# Patient Record
Sex: Female | Born: 1980
Health system: Southern US, Community
[De-identification: ages and names within clinical notes are randomized; demographics above are authoritative.]

## PROBLEM LIST (undated history)

## (undated) DIAGNOSIS — F329 Major depressive disorder, single episode, unspecified: Secondary | ICD-10-CM

## (undated) DIAGNOSIS — F32A Depression, unspecified: Secondary | ICD-10-CM

## (undated) HISTORY — DX: Major depressive disorder, single episode, unspecified: F32.9

## (undated) HISTORY — DX: Depression, unspecified: F32.A

---

## 1999-12-18 ENCOUNTER — Other Ambulatory Visit: Admission: RE | Admit: 1999-12-18 | Discharge: 1999-12-18 | Payer: Self-pay | Admitting: *Deleted

## 2000-03-05 ENCOUNTER — Emergency Department (HOSPITAL_COMMUNITY): Admission: EM | Admit: 2000-03-05 | Discharge: 2000-03-05 | Payer: Self-pay | Admitting: Emergency Medicine

## 2001-01-06 ENCOUNTER — Other Ambulatory Visit: Admission: RE | Admit: 2001-01-06 | Discharge: 2001-01-06 | Payer: Self-pay | Admitting: *Deleted

## 2002-03-28 ENCOUNTER — Inpatient Hospital Stay (HOSPITAL_COMMUNITY): Admission: AD | Admit: 2002-03-28 | Discharge: 2002-03-28 | Payer: Self-pay | Admitting: *Deleted

## 2002-03-31 ENCOUNTER — Inpatient Hospital Stay (HOSPITAL_COMMUNITY): Admission: AD | Admit: 2002-03-31 | Discharge: 2002-03-31 | Payer: Self-pay | Admitting: Obstetrics and Gynecology

## 2002-04-08 ENCOUNTER — Inpatient Hospital Stay (HOSPITAL_COMMUNITY): Admission: AD | Admit: 2002-04-08 | Discharge: 2002-04-08 | Payer: Self-pay | Admitting: *Deleted

## 2002-04-09 ENCOUNTER — Encounter: Payer: Self-pay | Admitting: Obstetrics and Gynecology

## 2002-05-09 ENCOUNTER — Other Ambulatory Visit: Admission: RE | Admit: 2002-05-09 | Discharge: 2002-05-09 | Payer: Self-pay | Admitting: Obstetrics and Gynecology

## 2002-07-04 ENCOUNTER — Encounter: Payer: Self-pay | Admitting: Obstetrics and Gynecology

## 2002-07-04 ENCOUNTER — Ambulatory Visit (HOSPITAL_COMMUNITY): Admission: RE | Admit: 2002-07-04 | Discharge: 2002-07-04 | Payer: Self-pay | Admitting: Obstetrics and Gynecology

## 2002-11-11 ENCOUNTER — Inpatient Hospital Stay (HOSPITAL_COMMUNITY): Admission: AD | Admit: 2002-11-11 | Discharge: 2002-11-11 | Payer: Self-pay | Admitting: Obstetrics and Gynecology

## 2002-11-15 ENCOUNTER — Inpatient Hospital Stay (HOSPITAL_COMMUNITY): Admission: AD | Admit: 2002-11-15 | Discharge: 2002-11-17 | Payer: Self-pay | Admitting: Obstetrics and Gynecology

## 2002-11-15 ENCOUNTER — Encounter (INDEPENDENT_AMBULATORY_CARE_PROVIDER_SITE_OTHER): Payer: Self-pay

## 2002-11-18 ENCOUNTER — Encounter: Admission: RE | Admit: 2002-11-18 | Discharge: 2002-12-18 | Payer: Self-pay | Admitting: Obstetrics and Gynecology

## 2002-12-19 ENCOUNTER — Encounter: Admission: RE | Admit: 2002-12-19 | Discharge: 2003-01-18 | Payer: Self-pay | Admitting: Obstetrics and Gynecology

## 2003-01-19 ENCOUNTER — Encounter: Admission: RE | Admit: 2003-01-19 | Discharge: 2003-02-18 | Payer: Self-pay | Admitting: Obstetrics and Gynecology

## 2003-03-20 ENCOUNTER — Encounter: Admission: RE | Admit: 2003-03-20 | Discharge: 2003-04-19 | Payer: Self-pay | Admitting: Obstetrics and Gynecology

## 2003-05-20 ENCOUNTER — Encounter: Admission: RE | Admit: 2003-05-20 | Discharge: 2003-06-19 | Payer: Self-pay | Admitting: Obstetrics and Gynecology

## 2003-06-20 ENCOUNTER — Emergency Department (HOSPITAL_COMMUNITY): Admission: EM | Admit: 2003-06-20 | Discharge: 2003-06-20 | Payer: Self-pay | Admitting: Emergency Medicine

## 2003-07-20 ENCOUNTER — Encounter: Admission: RE | Admit: 2003-07-20 | Discharge: 2003-08-19 | Payer: Self-pay | Admitting: Obstetrics and Gynecology

## 2003-08-20 ENCOUNTER — Encounter: Admission: RE | Admit: 2003-08-20 | Discharge: 2003-09-19 | Payer: Self-pay | Admitting: Obstetrics and Gynecology

## 2004-08-30 ENCOUNTER — Emergency Department (HOSPITAL_COMMUNITY): Admission: EM | Admit: 2004-08-30 | Discharge: 2004-08-30 | Payer: Self-pay | Admitting: Emergency Medicine

## 2006-05-30 ENCOUNTER — Emergency Department (HOSPITAL_COMMUNITY): Admission: EM | Admit: 2006-05-30 | Discharge: 2006-05-30 | Payer: Self-pay | Admitting: Emergency Medicine

## 2010-09-12 ENCOUNTER — Emergency Department (HOSPITAL_COMMUNITY): Admission: EM | Admit: 2010-09-12 | Discharge: 2010-09-12 | Payer: Self-pay | Admitting: Emergency Medicine

## 2010-10-03 ENCOUNTER — Emergency Department (HOSPITAL_COMMUNITY): Admission: EM | Admit: 2010-10-03 | Discharge: 2010-10-03 | Payer: Self-pay | Admitting: Emergency Medicine

## 2011-04-18 NOTE — H&P (Signed)
   NAME:  Debbie White, Debbie White                      ACCOUNT NO.:  [id number]   MEDICAL RECORD NO.:  [id number]                   PATIENT TYPE:  INP   LOCATION:  9169                                 FACILITY:  [facility]   PHYSICIAN:  Hal Morales, M.D.             DATE OF BIRTH:  [birth year]   DATE OF ADMISSION:  11/15/2002  DATE OF DISCHARGE:                                HISTORY & PHYSICAL   HISTORY OF PRESENT ILLNESS:  This is a 30 year old gravida 3, para 1-0-1-1  at 20 and 1/7 weeks who presents for elective induction of labor for post  dates.  Pregnancy has been followed by the C.N.M. service and remarkable for  first trimester bleeding, second trimester nausea and vomiting, group B  strep positive, penicillin allergic.   OB HISTORY:  Remarkable for an elective AB in 1995 and a vaginal delivery in  1997 of a female infant at [redacted] weeks gestation weighing 7 pounds 5 ounces.  Remarkable for post dates induction and prolonged latent phase.   PAST MEDICAL HISTORY:  Remarkable for varicella as a child, pyelonephritis  at age 58, and previous smoker.   PAST SURGICAL HISTORY:  Remarkable for elective abortion 1995 and  childbirth.   FAMILY HISTORY:  Remarkable for a grandfather with hypertension, grandfather  with diabetes, mother with migraines, and a mother who smokes.   GENETIC HISTORY:  Unremarkable.   SOCIAL HISTORY:  The patient is single.  Father of the baby, Tiawanna Luchsinger is  involved and supportive.  She is of the [place] faith.  She denies any  alcohol, tobacco, or drug use.   PHYSICAL EXAMINATION:  VITAL SIGNS:  Stable, afebrile.  HEENT:  Within normal limits.  NECK:  Thyroid normal, not enlarged.  CHEST:  Clear to auscultation.  HEART:  Regular rate and rhythm.  ABDOMEN:  Gravid at 40 cm.  Vertex to Leopold's.  Electronic fetal  monitoring did note a reactive fetal heart rate tracing with occasional  irregular uterine contractions.  PELVIC:  Cervical examination 1+  cm, 70% effaced, -2/-3 station, vertex  presentation.  Posterior cervix.  EXTREMITIES:  Trace edema.   ASSESSMENT:  1. Intrauterine pregnancy at 41 and 1/7 weeks.  2. Elective induction of labor.  3. Group B strep positive with penicillin allergy.   PLAN:  1. Admit to birthing suite.  Dr. Pennie Rushing notified.  2.     Routine C.N.M. orders.  3. Clindamycin prophylaxis.  4. Pitocin for low dose protocol.  Further orders to follow.     Marie L. Williams, C.N.M.                 Hal Morales, M.D.    MLW/MEDQ  D:  11/15/2002  T:  11/15/2002  Job:  [id number]

## 2016-02-22 DIAGNOSIS — Z975 Presence of (intrauterine) contraceptive device: Secondary | ICD-10-CM | POA: Insufficient documentation

## 2016-04-03 DIAGNOSIS — Z6836 Body mass index (BMI) 36.0-36.9, adult: Secondary | ICD-10-CM | POA: Diagnosis not present

## 2016-04-03 DIAGNOSIS — F419 Anxiety disorder, unspecified: Secondary | ICD-10-CM | POA: Diagnosis not present

## 2016-04-03 DIAGNOSIS — E669 Obesity, unspecified: Secondary | ICD-10-CM | POA: Diagnosis not present

## 2016-04-03 DIAGNOSIS — F909 Attention-deficit hyperactivity disorder, unspecified type: Secondary | ICD-10-CM | POA: Diagnosis not present

## 2016-05-06 DIAGNOSIS — R200 Anesthesia of skin: Secondary | ICD-10-CM | POA: Diagnosis not present

## 2016-05-06 DIAGNOSIS — Z6836 Body mass index (BMI) 36.0-36.9, adult: Secondary | ICD-10-CM | POA: Diagnosis not present

## 2016-05-06 DIAGNOSIS — F909 Attention-deficit hyperactivity disorder, unspecified type: Secondary | ICD-10-CM | POA: Diagnosis not present

## 2016-05-14 DIAGNOSIS — H9209 Otalgia, unspecified ear: Secondary | ICD-10-CM | POA: Diagnosis not present

## 2016-05-14 DIAGNOSIS — H66002 Acute suppurative otitis media without spontaneous rupture of ear drum, left ear: Secondary | ICD-10-CM | POA: Diagnosis not present

## 2016-05-14 DIAGNOSIS — M79642 Pain in left hand: Secondary | ICD-10-CM | POA: Diagnosis not present

## 2016-05-14 DIAGNOSIS — R05 Cough: Secondary | ICD-10-CM | POA: Diagnosis not present

## 2016-05-14 DIAGNOSIS — M79641 Pain in right hand: Secondary | ICD-10-CM | POA: Diagnosis not present

## 2016-07-03 DIAGNOSIS — E669 Obesity, unspecified: Secondary | ICD-10-CM | POA: Diagnosis not present

## 2016-07-03 DIAGNOSIS — F909 Attention-deficit hyperactivity disorder, unspecified type: Secondary | ICD-10-CM | POA: Diagnosis not present

## 2016-07-03 DIAGNOSIS — Z6837 Body mass index (BMI) 37.0-37.9, adult: Secondary | ICD-10-CM | POA: Diagnosis not present

## 2016-07-03 DIAGNOSIS — R200 Anesthesia of skin: Secondary | ICD-10-CM | POA: Diagnosis not present

## 2016-08-19 DIAGNOSIS — H02849 Edema of unspecified eye, unspecified eyelid: Secondary | ICD-10-CM | POA: Diagnosis not present

## 2016-12-05 DIAGNOSIS — E669 Obesity, unspecified: Secondary | ICD-10-CM | POA: Diagnosis not present

## 2016-12-05 DIAGNOSIS — F419 Anxiety disorder, unspecified: Secondary | ICD-10-CM | POA: Diagnosis not present

## 2016-12-05 DIAGNOSIS — F909 Attention-deficit hyperactivity disorder, unspecified type: Secondary | ICD-10-CM | POA: Diagnosis not present

## 2016-12-05 DIAGNOSIS — Z6838 Body mass index (BMI) 38.0-38.9, adult: Secondary | ICD-10-CM | POA: Diagnosis not present

## 2016-12-05 DIAGNOSIS — Z1389 Encounter for screening for other disorder: Secondary | ICD-10-CM | POA: Diagnosis not present

## 2017-02-12 DIAGNOSIS — J9801 Acute bronchospasm: Secondary | ICD-10-CM | POA: Diagnosis not present

## 2017-02-12 DIAGNOSIS — J209 Acute bronchitis, unspecified: Secondary | ICD-10-CM | POA: Diagnosis not present

## 2017-03-02 DIAGNOSIS — H6121 Impacted cerumen, right ear: Secondary | ICD-10-CM | POA: Diagnosis not present

## 2017-03-13 DIAGNOSIS — G2581 Restless legs syndrome: Secondary | ICD-10-CM | POA: Diagnosis not present

## 2017-03-13 DIAGNOSIS — E669 Obesity, unspecified: Secondary | ICD-10-CM | POA: Diagnosis not present

## 2017-03-13 DIAGNOSIS — Z6838 Body mass index (BMI) 38.0-38.9, adult: Secondary | ICD-10-CM | POA: Diagnosis not present

## 2017-03-13 DIAGNOSIS — F909 Attention-deficit hyperactivity disorder, unspecified type: Secondary | ICD-10-CM | POA: Diagnosis not present

## 2017-04-13 DIAGNOSIS — E669 Obesity, unspecified: Secondary | ICD-10-CM | POA: Diagnosis not present

## 2017-04-13 DIAGNOSIS — F900 Attention-deficit hyperactivity disorder, predominantly inattentive type: Secondary | ICD-10-CM | POA: Diagnosis not present

## 2017-04-13 DIAGNOSIS — Z6838 Body mass index (BMI) 38.0-38.9, adult: Secondary | ICD-10-CM | POA: Diagnosis not present

## 2017-07-16 DIAGNOSIS — F419 Anxiety disorder, unspecified: Secondary | ICD-10-CM | POA: Diagnosis not present

## 2017-07-16 DIAGNOSIS — Z79899 Other long term (current) drug therapy: Secondary | ICD-10-CM | POA: Diagnosis not present

## 2017-07-16 DIAGNOSIS — F909 Attention-deficit hyperactivity disorder, unspecified type: Secondary | ICD-10-CM | POA: Diagnosis not present

## 2017-07-16 DIAGNOSIS — E669 Obesity, unspecified: Secondary | ICD-10-CM | POA: Diagnosis not present

## 2017-07-16 DIAGNOSIS — Z6838 Body mass index (BMI) 38.0-38.9, adult: Secondary | ICD-10-CM | POA: Diagnosis not present

## 2017-10-14 DIAGNOSIS — M549 Dorsalgia, unspecified: Secondary | ICD-10-CM | POA: Diagnosis not present

## 2017-10-20 DIAGNOSIS — F419 Anxiety disorder, unspecified: Secondary | ICD-10-CM | POA: Diagnosis not present

## 2017-10-20 DIAGNOSIS — F909 Attention-deficit hyperactivity disorder, unspecified type: Secondary | ICD-10-CM | POA: Diagnosis not present

## 2017-10-20 DIAGNOSIS — Z79899 Other long term (current) drug therapy: Secondary | ICD-10-CM | POA: Diagnosis not present

## 2017-10-20 DIAGNOSIS — Z1331 Encounter for screening for depression: Secondary | ICD-10-CM | POA: Diagnosis not present

## 2017-10-20 DIAGNOSIS — R030 Elevated blood-pressure reading, without diagnosis of hypertension: Secondary | ICD-10-CM | POA: Diagnosis not present

## 2017-10-28 ENCOUNTER — Other Ambulatory Visit: Payer: Self-pay

## 2017-10-28 ENCOUNTER — Encounter: Payer: Self-pay | Admitting: Physician Assistant

## 2017-10-28 ENCOUNTER — Ambulatory Visit: Payer: BLUE CROSS/BLUE SHIELD | Admitting: Physician Assistant

## 2017-10-28 ENCOUNTER — Telehealth: Payer: Self-pay | Admitting: Physician Assistant

## 2017-10-28 ENCOUNTER — Ambulatory Visit (HOSPITAL_COMMUNITY)
Admission: RE | Admit: 2017-10-28 | Discharge: 2017-10-28 | Disposition: A | Discharge status: Home / Self Care | Payer: BLUE CROSS/BLUE SHIELD | Source: Ambulatory Visit | Attending: Physician Assistant | Admitting: Physician Assistant

## 2017-10-28 VITALS — BP 136/94 | HR 97 | Temp 98.6°F | Resp 16 | Ht 61.0 in | Wt 217.0 lb

## 2017-10-28 DIAGNOSIS — G4482 Headache associated with sexual activity: Secondary | ICD-10-CM | POA: Diagnosis not present

## 2017-10-28 DIAGNOSIS — R030 Elevated blood-pressure reading, without diagnosis of hypertension: Secondary | ICD-10-CM

## 2017-10-28 DIAGNOSIS — G4453 Primary thunderclap headache: Secondary | ICD-10-CM | POA: Diagnosis not present

## 2017-10-28 LAB — POCT CBC
Granulocyte percent: 70.2 % (ref 37–80)
HCT, POC: 45.7 % (ref 37.7–47.9)
Hemoglobin: 15.3 g/dL (ref 12.2–16.2)
Lymph, poc: 2.7 (ref 0.6–3.4)
MCH, POC: 31 pg (ref 27–31.2)
MCHC: 33.5 g/dL (ref 31.8–35.4)
MCV: 92.6 fL (ref 80–97)
MID (cbc): 0.4 (ref 0–0.9)
MPV: 8.7 fL (ref 0–99.8)
POC Granulocyte: 7.2 — AB (ref 2–6.9)
POC LYMPH PERCENT: 26 %L (ref 10–50)
POC MID %: 3.8 %M (ref 0–12)
Platelet Count, POC: 315 10*3/uL (ref 142–424)
RBC: 4.94 M/uL (ref 4.04–5.48)
RDW, POC: 13.3 %
WBC: 10.3 10*3/uL — AB (ref 4.6–10.2)

## 2017-10-28 LAB — POCT URINE PREGNANCY: Preg Test, Ur: NEGATIVE

## 2017-10-28 MED ORDER — IOPAMIDOL (ISOVUE-370) INJECTION 76%
80.0000 mL | Freq: Once | INTRAVENOUS | Status: AC | PRN
  Administered 2017-10-28: 100 mL via INTRAVENOUS

## 2017-10-28 NOTE — Progress Notes (Signed)
   Debbie White  MRN: [id number] DOB: 1980/12/04  PCP: Patient, No Pcp Per  Subjective:  Pt is a pleasant 36 year old female who presents to clinic for headache. One episode HA 3 days ago, sudden onset of the "worst headache of my life". Occurred during intercourse at the moment of orgasm. HA lasted about 5 minutes. Left-sided. "stabbing pain". + photophobia She has been asymptomatic since that time. She is scared to have intercourse now.  Denies n/v, muscle weakness, one-sided weakness, difficulty with speech, behavioral changes, facial drooping, abnormal gait, dysgeusia    Elevated blood pressure - 138/100. Never been treated for high blood pressure.   FHx: Mother - migraines  MGF - stroke x 2  Review of Systems  Constitutional: Negative for chills, diaphoresis, fatigue and fever.  Eyes: Positive for photophobia and visual disturbance.  Respiratory: Negative for cough.   Cardiovascular: Negative for chest pain and palpitations.  Gastrointestinal: Negative for nausea and vomiting.  Musculoskeletal: Negative for neck pain and neck stiffness.  Neurological: Positive for headaches. Negative for dizziness, syncope, facial asymmetry, speech difficulty, weakness, light-headedness and numbness.  Psychiatric/Behavioral: Negative for behavioral problems and confusion.    There are no active problems to display for this patient.   Current Outpatient Medications on File Prior to Visit  Medication Sig Dispense Refill  . amphetamine-dextroamphetamine (ADDERALL) 10 MG tablet Take 10 mg by mouth daily with breakfast.    . amphetamine-dextroamphetamine (ADDERALL) 20 MG tablet Take 20 mg by mouth daily.    Marland Kitchen levonorgestrel (MIRENA) 20 MCG/24HR IUD 1 each by Intrauterine route once.    . venlafaxine (EFFEXOR) 75 MG tablet Take 75 mg by mouth 2 (two) times daily.     No current facility-administered medications on file prior to visit.     No Known Allergies   Objective:  BP (!) 138/100    Pulse (!) 108   Temp 98.6 F (37 C) (Oral)   Resp 16   Ht '5\' 1"'$  (1.549 m)   Wt 217 lb (98.4 kg)   SpO2 95%   BMI 41.00 kg/m   Physical Exam  Constitutional: She is oriented to person, place, and time and well-developed, well-nourished, and in no distress. No distress.  Eyes: EOM are normal. Pupils are equal, round, and reactive to light.  Cardiovascular: Normal rate, regular rhythm and normal heart sounds.  Neurological: She is alert and oriented to person, place, and time. She has normal motor skills, normal sensation, normal strength, normal reflexes and intact cranial nerves. GCS score is 15.  Skin: Skin is warm and dry.  Psychiatric: Mood, memory, affect and judgment normal.  Vitals reviewed.   Assessment and Plan :  1. Headache associated with sexual activity - POCT urine pregnancy - CT ANGIO HEAD W OR WO CONTRAST; Future - Creatinine, serum; Future - Pt c/o worse headache of her life associated with sexual activity 3 days ago. Asymptomatic since that time. Plan to get imaging r/o intracranial bleed. Discussed this with pt, she understands. If imaging is negative, plan to RTC if symptoms recur. Will treat for migraine. She understands and agrees with plan.  2. Elevated blood pressure reading - Lipid panel - CMP14+EGFR - POCT CBC - Recheck vitals   Mercer Pod, PA-C  [facility] at [facility] 10/28/2017 10:26 AM

## 2017-10-28 NOTE — Telephone Encounter (Signed)
Initiated Berkley Harveyauth with [employer] for STAT CT Angio Head With or Without Contrast but this was not approved. It stated this did not meet medical criteria due to no CT or MRI being performed prior.The rep I spoke with said a physician could call the physician reviewer line at [phone] to try to get this approved. CT was performed at 3:40 today so this would now need to be a retro auth.

## 2017-10-28 NOTE — Patient Instructions (Addendum)
You are schedule for a CT exam today at [facility] at 3pm. Please arrive to main entrance of the hospital 15 minutes prior to your scheduled appointment and you will be guided to the radiology department. DO NOT GO TO EMERGENCY DEPARTMENT.   We will contact you with results of the imaging.   Thank you for coming in today. I hope you feel we met your needs.  Feel free to call PCP if you have any questions or further requests.  Please consider signing up for MyChart if you do not already have it, as this is a great way to communicate with me.  Best,  Whitney McVey, PA-C   IF you received an x-ray today, you will receive an invoice from [facility]. Please contact [facility] at [phone] with questions or concerns regarding your invoice.   IF you received labwork today, you will receive an invoice from [facility]. Please contact [facility] at [phone] with questions or concerns regarding your invoice.   Our billing staff will not be able to assist you with questions regarding bills from these companies.  You will be contacted with the lab results as soon as they are available. The fastest way to get your results is to activate your My Chart account. Instructions are located on the last page of this paperwork. If you have not heard from [place] regarding the results in 2 weeks, please contact this office.

## 2017-10-29 LAB — CMP14+EGFR
ALT: 25 IU/L (ref 0–32)
AST: 20 IU/L (ref 0–40)
Albumin/Globulin Ratio: 1.4 (ref 1.2–2.2)
Albumin: 4.3 g/dL (ref 3.5–5.5)
Alkaline Phosphatase: 99 IU/L (ref 39–117)
BUN/Creatinine Ratio: 19 (ref 9–23)
BUN: 11 mg/dL (ref 6–20)
Bilirubin Total: 0.3 mg/dL (ref 0.0–1.2)
CO2: 20 mmol/L (ref 20–29)
Calcium: 9 mg/dL (ref 8.7–10.2)
Chloride: 102 mmol/L (ref 96–106)
Creatinine, Ser: 0.58 mg/dL (ref 0.57–1.00)
GFR calc Af Amer: 135 mL/min/{1.73_m2} (ref >59)
GFR calc non Af Amer: 117 mL/min/{1.73_m2} (ref >59)
Globulin, Total: 3 g/dL (ref 1.5–4.5)
Glucose: 108 mg/dL — ABNORMAL HIGH (ref 65–99)
Potassium: 4.5 mmol/L (ref 3.5–5.2)
Sodium: 138 mmol/L (ref 134–144)
Total Protein: 7.3 g/dL (ref 6.0–8.5)

## 2017-10-29 LAB — LIPID PANEL
Chol/HDL Ratio: 5.8 ratio — ABNORMAL HIGH (ref 0.0–4.4)
Cholesterol, Total: 193 mg/dL (ref 100–199)
HDL: 33 mg/dL — ABNORMAL LOW (ref >39)
LDL Calculated: 132 mg/dL — ABNORMAL HIGH (ref 0–99)
Triglycerides: 140 mg/dL (ref 0–149)
VLDL Cholesterol Cal: 28 mg/dL (ref 5–40)

## 2017-10-29 NOTE — Telephone Encounter (Signed)
CT Angio issue sent to McVey for review -

## 2017-11-11 ENCOUNTER — Ambulatory Visit: Payer: BLUE CROSS/BLUE SHIELD | Admitting: Physician Assistant

## 2017-11-17 ENCOUNTER — Ambulatory Visit: Payer: BLUE CROSS/BLUE SHIELD | Admitting: Physician Assistant

## 2017-12-23 ENCOUNTER — Other Ambulatory Visit: Payer: Self-pay

## 2017-12-23 ENCOUNTER — Ambulatory Visit: Payer: BLUE CROSS/BLUE SHIELD | Admitting: Physician Assistant

## 2017-12-23 ENCOUNTER — Encounter: Payer: Self-pay | Admitting: Physician Assistant

## 2017-12-23 VITALS — BP 128/80 | HR 118 | Temp 98.7°F | Ht 61.0 in | Wt 222.0 lb

## 2017-12-23 DIAGNOSIS — F339 Major depressive disorder, recurrent, unspecified: Secondary | ICD-10-CM | POA: Diagnosis not present

## 2017-12-23 DIAGNOSIS — Z6841 Body Mass Index (BMI) 40.0 and over, adult: Secondary | ICD-10-CM

## 2017-12-23 DIAGNOSIS — F988 Other specified behavioral and emotional disorders with onset usually occurring in childhood and adolescence: Secondary | ICD-10-CM | POA: Diagnosis not present

## 2017-12-23 DIAGNOSIS — Z79899 Other long term (current) drug therapy: Secondary | ICD-10-CM | POA: Diagnosis not present

## 2017-12-23 DIAGNOSIS — R0683 Snoring: Secondary | ICD-10-CM | POA: Diagnosis not present

## 2017-12-23 DIAGNOSIS — R5383 Other fatigue: Secondary | ICD-10-CM

## 2017-12-23 MED ORDER — ADDERALL XR 20 MG PO CP24: ORAL_CAPSULE | ORAL | 0 refills | Status: DC

## 2017-12-23 MED ORDER — AMPHETAMINE-DEXTROAMPHET ER 20 MG PO CP24: 20.0000 mg | ORAL_CAPSULE | ORAL | 0 refills | Status: DC

## 2017-12-23 MED ORDER — VENLAFAXINE HCL ER 150 MG PO CP24: 150.0000 mg | ORAL_CAPSULE | Freq: Every day | ORAL | 3 refills | Status: DC

## 2017-12-23 NOTE — Patient Instructions (Addendum)
You will receive a phone call to schedule an appointment for sleep studies and medical weight management.   Come back and see me in 3 months for medication refill.    These are some of my general health and wellness recommendations. Some of them may apply to you better than others. Please use common sense as you try these suggestions and feel free to ask me any questions.  ACTIVITY/FITNESS Mental, social, emotional and physical stimulation are very important for brain and body health. Try learning a new activity (arts, music, language, sports, games).  Keep moving your body to the best of your abilities. You can do this at home, inside or outside, the park, community center, gym or anywhere you like. Consider a physical therapist or personal trainer to get started. Consider the app Sworkit. Fitness trackers such as smart-watches, smart-phones or Fitbits can help as well.   RELAXATION Consider practicing mindfulness meditation or other relaxation techniques such as deep breathing, prayer, yoga, tai chi, massage. See website mindful.org or the apps Headspace or Calm to help get started.  NUTRITION Drink water when you are thirsty. Warm water with a slice of lemon is an excellent morning drink to start the day. Consider these websites for more information The Nutrition Source (https://www.henry-hernandez.biz/) Precision Nutrition (WindowBlog.ch)  Try to shop mostly along the perimeter of the grocery store. Cut down consumption of processed foods.   The following foods are the foundation of a heart-healthy diet: Vegetables such as greens (spinach, collard greens, kale), broccoli, cabbage, carrots, bell peppers; stay away from starchy vegetables like potatoes, carrots, peas Fruits such as avocados, apples, berries, bananas, oranges, pears, grapes, and prunes  Whole grains such as plain oatmeal, brown rice, and whole-grain bread or tortillas   Fat-free or low-fat dairy foods such as milk, cheese, or yogurt  Protein-rich foods:  Fish high in omega-3 fatty acids, such as salmon, tuna, and trout, about 8 ounces a week  Lean meats such as 95 percent lean ground beef or pork tenderloin  Poultry such as skinless chicken or [place]  Eggs  Nuts, seeds, and soy products: quinoa, chia seeds Legumes such as kidney beans, lentils, chickpeas, black-eyed peas, and lima beans Oils and foods containing high levels of monounsaturated and polyunsaturated fats that can help lower blood cholesterol levels and the risk of cardiovascular disease. Some sources of these oils are:  Canola, corn, olive, safflower, sesame, sunflower, and soybean oils  Nuts such as walnuts, almonds, and pine nuts  Nut and seed butters  Salmon and trout  Seeds such as sesame, sunflower, pumpkin, or flax  Avocados  Tofu  Brussel sprouts - Cut off stems. Place in a mixing bowl that has a lid. Pour in a 1/4-1/2 cup olive oil, spices, use a light amount of parmesan. Place on a baking sheet. Bake for 10 minutes at 400F. Take it out, eat the brussel chips. Place for another 5-10 minutes.   Mashed cauliflower - Boil a bunch of cauliflower in a pot of water. Blend in a food processor with 1-2 tablespoons of butter.  Spaghetti squash -  Cut the squash in half very carefully, clean out seeds from the middle. Place 1/2 face down in a microwave safe dish with at least 2 inches of water. Make 4-6 slits on outside of spaghetti squash and microwave for 10-12 minutes. Take out the spaghetti using a metal spoon. Repeat for the other half.   Vega protein is good protein powder, make sure you use ~6 ice  cubes to give it smoothie consistency together with ~4-6 ounces of vanilla soy milk. Throw cinnamon into your shake, use peanut butter. You can also use the fruits listed above. Throw spinach or kale into the shake.   Recipe ideas: [employer], [employer], Lung, and [place]com, wholefoodsmarket.com  Limit added sugars When you follow a heart-healthy eating plan, you should limit the amount of calories you consume each day from added sugars. Because added sugars do not provide essential nutrients and are extra calories, limiting them can help you choose nutrient-rich foods and stay within your daily calorie limit. Some foods, such as fruit, contain natural sugars. Added sugars do not occur naturally in foods, but instead are used to sweeten foods and drinks. Some examples of added sugars include brown sugar, corn syrup, dextrose, fructose, glucose, high-fructose corn syrup, raw sugar, and sucrose. In the [place], sweetened drinks, snacks, and sweets are the major sources of added sugars. Sweetened drinks account for about half of all added sugars consumed. The following are examples of foods and drinks with added sugars. Sweetened drinks include soft drinks or sodas, fruit drinks, sweetened coffee and tea, energy drinks, alcoholic drinks, and favored waters.  Snacks and sweets include grain-based desserts such as cakes, pies, cookies, brownies, doughnuts; dairy desserts such as ice cream, frozen desserts, and pudding; candies; sugars; jams; syrups; and sweet toppings. To help you reduce the amount of added sugars in your diet: Choose unsweetened or whole fruits for snacks or dessert.  Choose drinks without added sugar such as water, low-fat or fat-free milk, or 100 percent fruit or vegetable juice.  Limit intake of sweetened drinks, snacks and desserts by eating them less often and in smaller amounts.  If you drink alcohol, you should limit your intake. Men should have no more than two alcoholic drinks per day. Women should have no more than one alcoholic drink per day. One drink is: 12 ounces of regular beer (5 percent alcohol)  5 ounces of wine (12 percent alcohol)  1 ounces of 80-proof liquor (40 percent alcohol)   Thank you for coming  in today. I hope you feel we met your needs.  Feel free to call PCP if you have any questions or further requests.  Please consider signing up for MyChart if you do not already have it, as this is a great way to communicate with me.  Best,  [employer], PA-C

## 2017-12-23 NOTE — Progress Notes (Signed)
Debbie White  MRN: [id number] DOB: 1981/03/16  PCP: Patient, No Pcp Per  Subjective:  Pt is a 37 year old female who presents to clinic for medication management.  Was receiving care in [place]. She would like to establish care here.   Depression - Effexor 150mg  x 10 years. She has suffered from depression from age 37. She had her first child at age 37 and left home. She would lock herself in a room and not come out for days.  She was inpatient at behavioral health in her teens after attempted suicide.  After the birth of her third child in her early 20's she saw a doctor for depression and started Effexor.  She has tried to lower dose to 75 mg about 5 years ago. Did not work - she experience withdrawal sympoms.    Adderall XR 20mg  qd.   Snoring - snoring has gotten worse since weight gain surpassing 190 lbs. She wears an adult diaper because she will urinate in her sleep. Her boyfriend has told her "Sometimes I'm afraid you aren't going to wake up" Pt endorses excessive daytime fatigue, can fall asleep in driver's seat. Never feels well rested when she wakes up.   Weight gain -  She is not able to exercise due to back pain "because of all this stuff in front of me". She weighs 222 lbs today. She turned in paper work yesterday for consult with Teacher, English as a foreign languagebariatric surgeon.   Review of Systems  Constitutional: Positive for fatigue.  Neurological: Negative for dizziness and headaches.  Psychiatric/Behavioral: Positive for dysphoric mood and sleep disturbance (snoring, apnic episodes). Negative for self-injury and suicidal ideas.    Patient Active Problem List   Diagnosis Date Noted  . Presence of of 52 mg levonorgestrel-releasing intrauterine device (IUD) 02/22/2016    Current Outpatient Medications on File Prior to Visit  Medication Sig Dispense Refill  . ADDERALL XR 20 MG 24 hr capsule TAKE 1 CAPSULE BY MOUTH EVERY DAY IN THE MORNING  0  . levonorgestrel (MIRENA) 20 MCG/24HR IUD 1 each  by Intrauterine route once.    . venlafaxine XR (EFFEXOR-XR) 150 MG 24 hr capsule Take by mouth daily.  12   No current facility-administered medications on file prior to visit.     No Known Allergies   Objective:  BP 128/80 (BP Location: Right Arm, Patient Position: Sitting, Cuff Size: Large)   Pulse (!) 118   Temp 98.7 F (37.1 C) (Oral)   Ht 5\' 1"  (1.549 m)   Wt 222 lb (100.7 kg)   SpO2 96%   BMI 41.95 kg/m   Physical Exam  Constitutional: She is oriented to person, place, and time and well-developed, well-nourished, and in no distress. No distress.  obese  Cardiovascular: Normal rate, regular rhythm and normal heart sounds.  Neurological: She is alert and oriented to person, place, and time. GCS score is 15.  Skin: Skin is warm and dry.  Psychiatric: Mood, memory, affect and judgment normal. She expresses no suicidal plans and no homicidal plans.  tearful  Vitals reviewed.   Assessment and Plan :  1. Encounter for medication management 2. Episode of recurrent major depressive disorder, unspecified depression episode severity (HCC) - venlafaxine XR (EFFEXOR-XR) 150 MG 24 hr capsule; Take 1 capsule (150 mg total) by mouth daily.  Dispense: 30 capsule; Refill: 3 - Depression x >10 years. Controlled with Effexor 150mg  qd. When discussing h/o depression and current situation, it came to light that depression is worsening  by her weight and sleep apnea, see below. Plan to refer. RTC in 3 months for recheck.  3. Attention deficit disorder (ADD) - ADDERALL XR 20 MG 24 hr capsule; TAKE 1 CAPSULE BY MOUTH EVERY DAY IN THE MORNING  Dispense: 30 capsule; Refill: 0  4. Snoring 5. Fatigue, unspecified type - Ambulatory referral to Sleep Studies - Pt endorses snoring, episodes of apnea during sleep, excessive daytime fatigue. She wears adult diaper at night as she will urinate on herself. Symptoms have worsened since her last 30lb weight gain. I feel she would greatly benefit from sleep  study and CPAP.   6. Class 3 severe obesity due to excess calories with body mass index (BMI) of 45.0 to 49.9 in adult, unspecified whether serious comorbidity present (HCC) - Amb Ref to Medical Weight Management - Pt depression and snoring worsening due to weight gain. She recently applied for consult with bariatric surgery. I believe she would benefit from eval and treatment by medical weight management. She understands and agrees.   Marco Collie, PA-C  Primary Care at [facility] 12/23/2017 12:13 PM

## 2018-01-14 ENCOUNTER — Other Ambulatory Visit (HOSPITAL_COMMUNITY): Payer: Self-pay | Admitting: Surgery

## 2018-01-14 DIAGNOSIS — F329 Major depressive disorder, single episode, unspecified: Secondary | ICD-10-CM | POA: Diagnosis not present

## 2018-01-14 DIAGNOSIS — F988 Other specified behavioral and emotional disorders with onset usually occurring in childhood and adolescence: Secondary | ICD-10-CM | POA: Diagnosis not present

## 2018-01-27 ENCOUNTER — Ambulatory Visit: Payer: BLUE CROSS/BLUE SHIELD | Admitting: Neurology

## 2018-01-27 ENCOUNTER — Encounter: Payer: Self-pay | Admitting: Neurology

## 2018-01-27 VITALS — BP 156/103 | HR 110 | Ht 67.0 in | Wt 225.0 lb

## 2018-01-27 DIAGNOSIS — R351 Nocturia: Secondary | ICD-10-CM | POA: Diagnosis not present

## 2018-01-27 DIAGNOSIS — R0683 Snoring: Secondary | ICD-10-CM | POA: Diagnosis not present

## 2018-01-27 DIAGNOSIS — R51 Headache: Secondary | ICD-10-CM

## 2018-01-27 DIAGNOSIS — G4750 Parasomnia, unspecified: Secondary | ICD-10-CM | POA: Diagnosis not present

## 2018-01-27 DIAGNOSIS — R32 Unspecified urinary incontinence: Secondary | ICD-10-CM

## 2018-01-27 DIAGNOSIS — E669 Obesity, unspecified: Secondary | ICD-10-CM

## 2018-01-27 DIAGNOSIS — G4719 Other hypersomnia: Secondary | ICD-10-CM

## 2018-01-27 DIAGNOSIS — R519 Headache, unspecified: Secondary | ICD-10-CM

## 2018-01-27 DIAGNOSIS — R0681 Apnea, not elsewhere classified: Secondary | ICD-10-CM

## 2018-01-27 NOTE — Patient Instructions (Signed)

## 2018-01-27 NOTE — Progress Notes (Signed)
Subjective:    Patient ID: Debbie White is a 37 y.o. female.  HPI     Huston Foley, MD, PhD [facility] [address], [address] [address] [place], [place] [address]  Dear Debbie White,  I saw your patient, Debbie White, upon your kind request in my neurologic clinic today for initial consultation of her sleep disorder, in particular, concern for underlying obstructive sleep apnea. The patient is unaccompanied today. As you know, Ms. Dinger is a 37 year old right-handed woman with an underlying medical history of depression, ADD, smoking and obesity, who reports snoring and excessive daytime somnolence as well as witnessed breathing pauses while asleep. She has had enuresis. I reviewed your office note from 12/23/2017. Her Epworth sleepiness score is 17 out of 24, fatigue score is 48 out of 63. She lives with her boyfriend and her daughter. She works at a [employer]. She smokes about 2 cigarettes daily, drinks alcohol occasionally, drinks caffeine in the form of soda, 3-4 cans per day on average. She drinks coffee in the morning, typically 1 cup. Rise time is around 6 AM and she has to take her daughter to school, etc. one hour drive, in the afternoon it gets to be very difficult for her to stay awake while driving. She works part-time, typically from 9:30 AM to 1:30 PM on Mondays, Wednesdays, and Fridays, bedtime is around 10:30 or 11. She has nocturia about twice per average night, has had the occasional morning headache, and in the past 2 months or so she has had worsening snoring and witnessed apneas as well as enuresis a few times. She has gained weight in the past 2-3 years. She has been on Adderall long-acting for about 10 years and Effexor long-acting for about 10 years. She has with time reduced her Adderall dose. She has no obvious family history of obstructive sleep apnea but does not know much about her father's medical history. She has had occasional  restless leg symptoms, more so during her pregnancies. She has a 15 year old daughter and a 68 year old daughter. She has had occasional sleep talking, yelling out in her sleep and movements. She does not recall her dreams typically.  Her Past Medical History Is Significant For: No past medical history on file.  Her Past Surgical History Is Significant For:  Her Family History Is Significant For: No family history on file.  Her Social History Is Significant For: Social History   Socioeconomic History  . Marital status: Single    Spouse name: None  . Number of children: None  . Years of education: None  . Highest education level: None  Social Needs  . Financial resource strain: None  . Food insecurity - worry: None  . Food insecurity - inability: None  . Transportation needs - medical: None  . Transportation needs - non-medical: None  Occupational History  . None  Tobacco Use  . Smoking status: Current Every Day Smoker  . Smokeless tobacco: Never Used  Substance and Sexual Activity  . Alcohol use: Yes    Comment: occas  . Drug use: No  . Sexual activity: None  Other Topics Concern  . None  Social History Narrative  . None    Her Allergies Are:  No Known Allergies:   Her Current Medications Are:  Outpatient Encounter Medications as of 01/27/2018  Medication Sig  . ADDERALL XR 20 MG 24 hr capsule TAKE 1 CAPSULE BY MOUTH EVERY DAY IN THE MORNING  . amphetamine-dextroamphetamine (ADDERALL XR) 20  MG 24 hr capsule Take 1 capsule (20 mg total) by mouth every morning.  Melene Muller ON 02/20/2018] amphetamine-dextroamphetamine (ADDERALL XR) 20 MG 24 hr capsule Take 1 capsule (20 mg total) by mouth every morning.  Marland Kitchen levonorgestrel (MIRENA) 20 MCG/24HR IUD 1 each by Intrauterine route once.  . venlafaxine XR (EFFEXOR-XR) 150 MG 24 hr capsule Take 1 capsule (150 mg total) by mouth daily.   No facility-administered encounter medications on file as of 01/27/2018.   : Review of  Systems:  Out of a complete 14 point review of systems, all are reviewed and negative with the exception of these symptoms as listed below:  Review of Systems  Neurological:       Pt presents today to discuss her sleep. Pt has never had a sleep study but does endorse snoring.  Epworth Sleepiness Scale 0= would never doze 1= slight chance of dozing 2= moderate chance of dozing 3= high chance of dozing  Sitting and reading: 2 Watching TV: 2 Sitting inactive in a public place (ex. Theater or meeting): 1 As a passenger in a car for an hour without a break: 3 Lying down to rest in the afternoon: 3 Sitting and talking to someone: 1 Sitting quietly after lunch (no alcohol): 2 In a car, while stopped in traffic: 3 Total: 17     Objective:  Neurological Exam  Physical Exam Physical Examination:   Vitals:   01/27/18 1057  BP: (!) 156/103  Pulse: (!) 110   General Examination: The patient is a very pleasant 37 y.o. female in no acute distress. She appears well-developed and well-nourished and well groomed.   HEENT: Normocephalic, atraumatic, pupils are equal, round and reactive to light and accommodation. Extraocular tracking is good without limitation to gaze excursion or nystagmus noted. Normal smooth pursuit is noted. Hearing is grossly intact. Face is symmetric with normal facial animation and normal facial sensation. Speech is clear with no dysarthria noted. There is no hypophonia. There is no lip, neck/head, jaw or voice tremor. Neck is supple with full range of passive and active motion. There are no carotid bruits on auscultation. Oropharynx exam reveals: mild mouth dryness, adequate dental hygiene and marked airway crowding, due to smaller airway entry, fairly long uvula, tonsils are 2-3+, Mallampati is class III, neck circumference is 16-3/4 inches. She has a larger and wider tongue. She has a minimal overbite. Tongue protrudes centrally and palate elevates  symmetrically.  Chest: Clear to auscultation without wheezing, rhonchi or crackles noted.  Heart: S1+S2+0, regular and normal without murmurs, rubs or gallops noted.   Abdomen: Soft, non-tender and non-distended with normal bowel sounds appreciated on auscultation.  Extremities: There is no pitting edema in the distal lower extremities bilaterally. Pedal pulses are intact.  Skin: Warm and dry without trophic changes noted.  Musculoskeletal: exam reveals no obvious joint deformities, tenderness or joint swelling or erythema.   Neurologically:  Mental status: The patient is awake, alert and oriented in all 4 spheres. Her immediate and remote memory, attention, language skills and fund of knowledge are appropriate. There is no evidence of aphasia, agnosia, apraxia or anomia. Speech is clear with normal prosody and enunciation. Thought process is linear. Mood is normal and affect is normal.  Cranial nerves II - XII are as described above under HEENT exam. In addition: shoulder shrug is normal with equal shoulder height noted. Motor exam: Normal bulk, strength and tone is noted. There is no drift, tremor or rebound. Romberg is negative. Reflexes are  2+ throughout. Fine motor skills and coordination: intact with normal finger taps, normal hand movements, normal rapid alternating patting, normal foot taps and normal foot agility.  Cerebellar testing: No dysmetria or intention tremor on finger to nose testing. Heel to shin is unremarkable bilaterally. There is no truncal or gait ataxia.  Sensory exam: intact to light touch in the upper and lower extremities.  Gait, station and balance: She stands easily. No veering to one side is noted. No leaning to one side is noted. Posture is age-appropriate and stance is narrow based. Gait shows normal stride length and normal pace. No problems turning are noted. Tandem walk is unremarkable.      Assessment and Plan:    In summary, Nino ParsleyMiranda M Bunch is a very  pleasant 37 y.o.-year old female with an underlying medical history of depression, ADD, smoking and obesity, whose history and physical exam are concerning for obstructive sleep apnea (OSA). I had a long chat with the patient about my findings and the diagnosis of OSA, its prognosis and treatment options. We talked about medical treatments, surgical interventions and non-pharmacological approaches. I explained in particular the risks and ramifications of untreated moderate to severe OSA, especially with respect to developing cardiovascular disease down the Road, including congestive heart failure, difficult to treat hypertension, cardiac arrhythmias, or stroke. Even type 2 diabetes has, in part, been linked to untreated OSA. Symptoms of untreated OSA include daytime sleepiness, memory problems, mood irritability and mood disorder such as depression and anxiety, lack of energy, as well as recurrent headaches, especially morning headaches. We talked about smoking cessation and trying to maintain a healthy lifestyle in general, as well as the importance of weight control. I encouraged the patient to eat healthy, exercise daily and keep well hydrated, to keep a scheduled bedtime and wake time routine, to not skip any meals and eat healthy snacks in between meals. I advised the patient not to drive when feeling sleepy. I recommended the following at this time: sleep study with potential positive airway pressure titration. (We will score hypopneas at 3%).   I explained the sleep test procedure to the patient and also outlined possible surgical and non-surgical treatment options of OSA, including the use of a custom-made dental device (which would require a referral to a specialist dentist or oral surgeon), upper airway surgical options, such as pillar implants, radiofrequency surgery, tongue base surgery, and UPPP (which would involve a referral to an ENT surgeon). Rarely, jaw surgery such as mandibular advancement  may be considered.  I also explained the CPAP treatment option to the patient, who indicated that she would be willing to try CPAP if the need arises. I explained the importance of being compliant with PAP treatment, not only for insurance purposes but primarily to improve Her symptoms, and for the patient's long term health benefit, including to reduce Her cardiovascular risks. I answered all her questions today and the patient was in agreement. I would like to see her back after the sleep study is completed and encouraged her to call with any interim questions, concerns, problems or updates.   Thank you very much for allowing me to participate in the care of this nice patient. If I can be of any further assistance to you please do not hesitate to call me at [phone].  Sincerely,   Huston FoleySaima Connery Shiffler, MD, PhD

## 2018-01-28 ENCOUNTER — Encounter: Payer: Self-pay | Admitting: Physician Assistant

## 2018-01-28 ENCOUNTER — Telehealth: Payer: Self-pay

## 2018-01-28 ENCOUNTER — Encounter: Payer: Self-pay | Admitting: Registered"

## 2018-01-28 ENCOUNTER — Encounter: Payer: BLUE CROSS/BLUE SHIELD | Attending: Surgery | Admitting: Registered"

## 2018-01-28 DIAGNOSIS — Z713 Dietary counseling and surveillance: Secondary | ICD-10-CM | POA: Diagnosis not present

## 2018-01-28 DIAGNOSIS — G4719 Other hypersomnia: Secondary | ICD-10-CM

## 2018-01-28 DIAGNOSIS — E669 Obesity, unspecified: Secondary | ICD-10-CM

## 2018-01-28 NOTE — Telephone Encounter (Signed)
HST order placed. 

## 2018-01-28 NOTE — Telephone Encounter (Signed)
BSBC denied in lab sleep study, Need HST order

## 2018-01-28 NOTE — Progress Notes (Signed)
Pre-Op Assessment Visit:  Pre-Operative Sleeve gastrectomy Surgery  Medical Nutrition Therapy:  Appt start time: 10:00  End time: 10:55  Patient was seen on 01/28/2018 for Pre-Operative Nutrition Assessment. Assessment and [facility] on 01/28/2018.   Pt expectation of surgery: increase energy, improve quality of life, be able to exercise again, improve back pain  Pt expectation of Dietitian: support, guidance on what to eat/not to eat, alternatives  Start weight at NDES: 225.5 BMI: 42.61   Pt states she has struggled with her weight. Pt states her back hurts really bad due even with washing dishes and cooking; has to take breaks between. Pt states she loves Coke, has several throughout her day. Pt states she loves cheese. Pt states she hates water unless it is really cold and plain.   Per insurance, pt needs 1 SWL visits prior to surgery.     24 hr Dietary Recall: First Meal: bologna sandwich Snack: mozzarella cheese sticks Second Meal: typically skips; [employer]-cheeseburger, fries Snack: sometimes crackers Third Meal: fish sticks, french fries Snack: cheese-its Beverages: coke, ginger ale, coffee, lemonade, water  Encouraged to engage in 150 minutes of moderate physical activity including cardiovascular and weight baring weekly  Handouts given during visit include:  . Pre-Op Goals . Bariatric Surgery Protein Shakes . Vitamin and Mineral Recommendations  During the appointment today the following Pre-Op Goals were reviewed with the patient: . Track your food and beverage: MyFitness Pal or Baritastic App . Make healthy food choices . Begin to limit portion sizes . Limited concentrated sugars and fried foods . Keep fat/sugar in the single digits per serving on food labels . Practice CHEWING your food  (aim for 30 chews per bite or until applesauce consistency) . Practice not drinking 15 minutes  before, during, and 30 minutes after each meal/snack . Avoid all carbonated beverages  . Avoid/limit caffeinated beverages  . Avoid all sugar-sweetened beverages . Avoid alcohol . Consume 3 meals per day; eat every 3-5 hours . Make a list of non-food related activities . Aim for 64-100 ounces of FLUID daily  . Aim for at least 60-80 grams of PROTEIN daily . Look for a liquid protein source that contain ?15 g protein and ?5 g carbohydrate  (ex: shakes, drinks, shots) . Physical activity is an important part of a healthy lifestyle so keep it moving!  Follow diet recommendations listed below Energy and Macronutrient Recommendations: Calories: 1800 Carbohydrate: 200 Protein: 135 Fat: 50  Demonstrated degree of understanding via:  Teach Back   Teaching Method Utilized:  Visual Auditory Hands on  Barriers to learning/adherence to lifestyle change: none identified  Patient to call the Nutrition and Diabetes Education Services to enroll in Pre-Op and Post-Op Nutrition Education when surgery date is scheduled.

## 2018-02-02 ENCOUNTER — Ambulatory Visit (HOSPITAL_COMMUNITY)
Admission: RE | Admit: 2018-02-02 | Discharge: 2018-02-02 | Disposition: A | Discharge status: Home / Self Care | Payer: BLUE CROSS/BLUE SHIELD | Source: Ambulatory Visit | Attending: Surgery | Admitting: Surgery

## 2018-02-02 ENCOUNTER — Other Ambulatory Visit: Payer: Self-pay

## 2018-02-02 DIAGNOSIS — Z01818 Encounter for other preprocedural examination: Secondary | ICD-10-CM | POA: Insufficient documentation

## 2018-02-02 DIAGNOSIS — K449 Diaphragmatic hernia without obstruction or gangrene: Secondary | ICD-10-CM | POA: Diagnosis not present

## 2018-02-02 DIAGNOSIS — K219 Gastro-esophageal reflux disease without esophagitis: Secondary | ICD-10-CM | POA: Diagnosis not present

## 2018-02-17 ENCOUNTER — Ambulatory Visit (INDEPENDENT_AMBULATORY_CARE_PROVIDER_SITE_OTHER): Payer: BLUE CROSS/BLUE SHIELD | Admitting: Neurology

## 2018-02-17 DIAGNOSIS — G4733 Obstructive sleep apnea (adult) (pediatric): Secondary | ICD-10-CM | POA: Diagnosis not present

## 2018-02-17 DIAGNOSIS — G4734 Idiopathic sleep related nonobstructive alveolar hypoventilation: Secondary | ICD-10-CM

## 2018-02-17 DIAGNOSIS — G4719 Other hypersomnia: Secondary | ICD-10-CM

## 2018-02-19 NOTE — Procedures (Signed)
  [facility] [address] [place], [place] [id number] NAME:  Debbie White                                                        DOB: 12-Mar-1981 MEDICAL RECORD NUMBER [id number]                                         DOS: 02/17/18 REFERRING PHYSICIAN: Madelaine BhatElizabeth Whitney, PA-C STUDY PERFORMED: Home Sleep Test HISTORY: 37 year old woman with a history of depression, ADD, smoking and obesity, who reports snoring and excessive daytime somnolence as well as witnessed breathing pauses while asleep. Her Epworth sleepiness score is 17 out of 24, BMI of 35.2.   STUDY RESULTS:  Total Recording Time:   8 hours,  8 minutes Total Apnea/Hypopnea Index (AHI):   76.0/h,  RDI: 78.9/h Average Oxygen Saturation:   87% , lowest Oxygen Desaturation:  less than 70%  Total Time Oxygen Saturation Below or at 88% was 224 minutes  Average Heart Rate:      91 bpm  IMPRESSION: Severe OSA; Nocturnal hypoxemia RECOMMENDATION: This home sleep test demonstrates severe obstructive sleep apnea with a total AHI of 76/hour and O2 nadir of less than 70% (48% reported was artifact/error), and significant time below 89% saturation. Given the patient's medical history and sleep related complaints, treatment with positive airway pressure (in the form of CPAP) is recommended. This will require a full night CPAP titration study for proper treatment settings, O2 monitoring and mask fitting. However, patient's insurance will cover autoPAP titration only. Based on the severity of the sleep disordered breathing an attended titration study is indicated. Please note that untreated obstructive sleep apnea carries additional perioperative morbidity. Patients with significant obstructive sleep apnea should receive perioperative PAP therapy and the surgeons and particularly the anesthesiologist should be informed of the diagnosis and the severity of the sleep disordered breathing. The patient should be cautioned not to  drive, work at heights, or operate dangerous or heavy equipment when tired or sleepy. Review and reiteration of good sleep hygiene measures should be pursued with any patient. Other causes of the patient's symptoms, including circadian rhythm disturbances, an underlying mood disorder, medication effect and/or an underlying medical problem cannot be ruled out based on this test. Clinical correlation is recommended.   I certify that I have reviewed the raw data recording prior to the issuance of this report in accordance with the standards of the American Academy of Sleep Medicine (AASM).  Huston FoleySaima Rameses Ou, MD, PhD Diplomat, ABPN (Neurology and Sleep)

## 2018-02-19 NOTE — Addendum Note (Signed)
Addended by: Huston FoleyATHAR, Malachi Suderman on: 02/19/2018 02:05 PM   Modules accepted: Orders

## 2018-02-19 NOTE — Progress Notes (Signed)
Patient referred by PCP, seen by me on 01/27/18, HST 02/17/18.    Please call and notify the patient that the recent home sleep test shows evidence of severe OSA. I recommend treatment for this in the form of CPAP, for which a sleep study would be necessary. However, her insurance will not authorize a sleep study, but would cover her for autoPAP, which means, that we will let her try an autoPAP machine at home, through a DME company (of her choice, or as per insurance requirement). The DME representative will educate her on how to use the machine, how to put the mask on, etc. I have placed an order in the chart. Please send referral, talk to patient, send report to referring MD. We will need a FU in sleep clinic for 10 weeks post-PAP set up, please arrange that with me or one of our NPs. Thanks,   Huston FoleySaima Ayan Yankey, MD, PhD [facility]

## 2018-02-22 ENCOUNTER — Telehealth: Payer: Self-pay | Admitting: Neurology

## 2018-02-22 NOTE — Telephone Encounter (Signed)
-----   Message from Huston FoleySaima Athar, MD sent at 02/19/2018  2:05 PM EDT ----- Patient referred by PCP, seen by me on 01/27/18, HST 02/17/18.    Please call and notify the patient that the recent home sleep test shows evidence of severe OSA. I recommend treatment for this in the form of CPAP, for which a sleep study would be necessary. However, her insurance will not authorize a sleep study, but would cover her for autoPAP, which means, that we will let her try an autoPAP machine at home, through a DME company (of her choice, or as per insurance requirement). The DME representative will educate her on how to use the machine, how to put the mask on, etc. I have placed an order in the chart. Please send referral, talk to patient, send report to referring MD. We will need a FU in sleep clinic for 10 weeks post-PAP set up, please arrange that with me or one of our NPs. Thanks,   Huston FoleySaima Athar, MD, PhD [facility]

## 2018-02-22 NOTE — Telephone Encounter (Signed)
I called pt. I advised pt that Dr. Frances FurbishAthar reviewed their sleep study results and found that pt has severe sleep apnea as well as nocturnal hypoxemia. Dr. Frances FurbishAthar recommends that pt starts a auto CPAP. I reviewed PAP compliance expectations with the pt. Pt is agreeable to starting a CPAP. I advised pt that an order will be sent to a DME, [facility], and [facility] will call the pt within about one week after they file with the pt's insurance. [facility] will show the pt how to use the machine, fit for masks, and troubleshoot the CPAP if needed. A follow up appt was made for insurance purposes with Dr. Frances FurbishAthar on May 11, 2018 at 10:30 am . Pt verbalized understanding to arrive 15 minutes early and bring their CPAP. A letter with all of this information in it will be mailed to the pt as a reminder. I verified with the pt that the address we have on file is correct. Pt verbalized understanding of results. Pt had no questions at this time but was encouraged to call back if questions arise.

## 2018-02-25 ENCOUNTER — Ambulatory Visit: Payer: BLUE CROSS/BLUE SHIELD | Admitting: Registered"

## 2018-02-25 DIAGNOSIS — F509 Eating disorder, unspecified: Secondary | ICD-10-CM | POA: Diagnosis not present

## 2018-02-26 ENCOUNTER — Encounter: Payer: BLUE CROSS/BLUE SHIELD | Attending: Surgery | Admitting: Registered"

## 2018-02-26 ENCOUNTER — Encounter: Payer: Self-pay | Admitting: Registered"

## 2018-02-26 DIAGNOSIS — Z713 Dietary counseling and surveillance: Secondary | ICD-10-CM | POA: Diagnosis not present

## 2018-02-26 DIAGNOSIS — E669 Obesity, unspecified: Secondary | ICD-10-CM

## 2018-02-26 NOTE — Patient Instructions (Addendum)
-   Try 1/2 flavor pack for 1 bottle of water.   - Continue to reduce caffeine intake.   - Try to not drink 15 minutes before eating, not while eating, and waiting 30 minutes after eating to drink.   - Add in accountability at work and/or home to help with not drinking during meal times.   Randie Heinz- Great job with everything. Keep up the great work!

## 2018-02-26 NOTE — Progress Notes (Signed)
Appt start time: 10:00 end time: 10:15  Assessment: 1st SWL Appointment.   Start Wt at NDES: 225.5 Wt: 223.4 BMI: 42.21   Pt arrives having lost about 2.1 lbs from previous visit.   Pt states she has done pretty good with chewing. Pt states she is still working on choosing items with sugar in single digits on nutrition facts label. Pt states she has been avoiding alcohol. Pt states she drinks 64+ ounces of fluid a day. Pt states she has been reducing caffeine intake by mixing half and half caffenaited with decaf cffee; reports this will be hard. Pt has increased meal intake to 3 meals a day. Pt has been making great behavioral changes and being proactive.   Per insurance, pt needs 1 SWL visits prior to surgery.    MEDICATIONS: See list   DIETARY INTAKE:  24-hr recall:  B ( AM): bologna sandwich Snk ( AM): mozzarella cheese sticks  L ( PM): apples, cheese, pretzels, peanut butter crackers  Snk ( PM): sometimes crackers D ( PM): fish sticks, french fries Snk ( PM): cheese-its Beverages: coke, ginger ale, coffee, lemonade, water  Usual physical activity: none stated  Diet to Follow: 1800 calories 200 g carbohydrates 135 g protein 50 g fat  Preferred Learning Style:   No preference indicated   Learning Readiness:   Ready  Change in progress     Nutritional Diagnosis:  [place]-3.3 Overweight/obesity related to past poor dietary habits and physical inactivity as evidenced by patient w/ planned sleeve gastrectomy surgery following dietary guidelines for continued weight loss.    Intervention:  Nutrition counseling for upcoming Bariatric Surgery.  Goals:  - Aim for 150 minutes of physical activity including cardio and weight bearing every week - Try 1/2 flavor pack for 1 bottle of water.  - Continue to reduce caffeine intake.  - Try to not drink 15 minutes before eating, not while eating, and waiting 30 minutes after eating to drink.  - Add in accountability at work  and/or home to help with not drinking during meal times.  Randie Heinz- Great job with everything. Keep up the great work!  Teaching Method Utilized:  Visual Auditory Hands on  Handouts given during visit include:  none  Barriers to learning/adherence to lifestyle change: none identified  Demonstrated degree of understanding via:  Teach Back   Monitoring/Evaluation:  Dietary intake, exercise, and body weight prn.

## 2018-03-17 DIAGNOSIS — G4733 Obstructive sleep apnea (adult) (pediatric): Secondary | ICD-10-CM | POA: Diagnosis not present

## 2018-03-23 ENCOUNTER — Encounter: Payer: Self-pay | Admitting: Physician Assistant

## 2018-03-23 ENCOUNTER — Other Ambulatory Visit: Payer: Self-pay

## 2018-03-23 ENCOUNTER — Ambulatory Visit (INDEPENDENT_AMBULATORY_CARE_PROVIDER_SITE_OTHER): Payer: BLUE CROSS/BLUE SHIELD | Admitting: Physician Assistant

## 2018-03-23 VITALS — BP 126/82 | HR 118 | Temp 98.7°F | Resp 18 | Ht 61.0 in | Wt 223.6 lb

## 2018-03-23 DIAGNOSIS — G4733 Obstructive sleep apnea (adult) (pediatric): Secondary | ICD-10-CM | POA: Insufficient documentation

## 2018-03-23 DIAGNOSIS — R000 Tachycardia, unspecified: Secondary | ICD-10-CM | POA: Diagnosis not present

## 2018-03-23 DIAGNOSIS — G4730 Sleep apnea, unspecified: Secondary | ICD-10-CM

## 2018-03-23 DIAGNOSIS — Z6841 Body Mass Index (BMI) 40.0 and over, adult: Secondary | ICD-10-CM | POA: Diagnosis not present

## 2018-03-23 DIAGNOSIS — E66813 Obesity, class 3: Secondary | ICD-10-CM

## 2018-03-23 DIAGNOSIS — F339 Major depressive disorder, recurrent, unspecified: Secondary | ICD-10-CM | POA: Diagnosis not present

## 2018-03-23 DIAGNOSIS — F988 Other specified behavioral and emotional disorders with onset usually occurring in childhood and adolescence: Secondary | ICD-10-CM | POA: Diagnosis not present

## 2018-03-23 MED ORDER — AMPHETAMINE-DEXTROAMPHET ER 20 MG PO CP24
20.0000 mg | ORAL_CAPSULE | ORAL | 0 refills | Status: DC
Start: 2018-03-23 — End: 2018-05-11

## 2018-03-23 MED ORDER — VENLAFAXINE HCL ER 150 MG PO CP24: 150.0000 mg | ORAL_CAPSULE | Freq: Every day | ORAL | 3 refills | Status: DC

## 2018-03-23 MED ORDER — AMPHETAMINE-DEXTROAMPHET ER 20 MG PO CP24: 20.0000 mg | ORAL_CAPSULE | ORAL | 0 refills | Status: DC

## 2018-03-23 MED ORDER — ADDERALL XR 20 MG PO CP24: ORAL_CAPSULE | ORAL | 0 refills | Status: DC

## 2018-03-23 NOTE — Patient Instructions (Addendum)
  Keep up the great work!   Let me know if you need a note for surgery.  Let me know when you need a refill of your medications and I will call them in.  Come back and see me in 6 months.    These are some of my general health and wellness recommendations. Some of them may apply to you better than others. Please use common sense as you try these suggestions and feel free to ask me any questions.   ACTIVITY/FITNESS Mental, social, emotional and physical stimulation are very important for brain and body health. Try learning a new activity (arts, music, language, sports, games).  Keep moving your body to the best of your abilities. You can do this at home, inside or outside, the park, community center, gym or anywhere you like. Consider a physical therapist or personal trainer to get started. Consider the app Sworkit. Fitness trackers such as smart-watches, smart-phones or Fitbits can help as well.   NUTRITION Eat more plants: colorful vegetables, nuts, seeds and berries.  Eat less sugar, salt, preservatives and processed foods.  Avoid toxins such as cigarettes and alcohol.  Drink water when you are thirsty. Warm water with a slice of lemon is an excellent morning drink to start the day.  Consider these websites for more information The Nutrition Source (https://www.henry-hernandez.biz/) Precision Nutrition (WindowBlog.ch)   RELAXATION Consider practicing mindfulness meditation or other relaxation techniques such as deep breathing, prayer, yoga, tai chi, massage. See website mindful.org or the apps Headspace or Calm to help get started.   SLEEP Try to get at least 7-8+ hours sleep per day. Regular exercise and reduced caffeine will help you sleep better. Practice good sleep hygeine techniques. See website sleep.org for more information.  Thank you for coming in today. I hope you feel we met your needs.  Feel free to call PCP  if you have any questions or further requests.  Please consider signing up for MyChart if you do not already have it, as this is a great way to communicate with me.  Best,  Whitney McVey, PA-C  IF you received an x-ray today, you will receive an invoice from [facility]. Please contact [facility] at [phone] with questions or concerns regarding your invoice.   IF you received labwork today, you will receive an invoice from [facility]. Please contact [facility] at [phone] with questions or concerns regarding your invoice.   Our billing staff will not be able to assist you with questions regarding bills from these companies.  You will be contacted with the lab results as soon as they are available. The fastest way to get your results is to activate your My Chart account. Instructions are located on the last page of this paperwork. If you have not heard from [place] regarding the results in 2 weeks, please contact this office.

## 2018-03-23 NOTE — Progress Notes (Signed)
Debbie White  MRN: [id number] DOB: Apr 05, 1981  PCP: Sebastian Ache, PA-C  Subjective:  Pt is a 37 year old female who presents to clinic for medication management.  Last OV was 12/23/3017, she was referred to sleep studies and medical weight management.    Depression x 10 years - Effexor XR 150mg  qd. This dose is working well for her. Recent lifestyle changes (see below) are improving depression symptoms.   Adderall XR 20mg  qd. Dose is working well.   Snoring - dx with severe sleep apnea. She got her full mask CPAP about 1.5 weeks ago. She is having a tough time getting used to the CPAP.  She is no longer having to use pad/diaper during the night. Waking up more well rested, she is less tired through the day. Her boyfriend has also noticed positive changes already, "sleeping more soundly".   Obesity -  "My goal for my 44th birthday is to be healthier than my 28th".   today she weighs 69. This is the same weight as 3 months ago. She has been seeing nutritionist. HPI from that visit: "Pt states she is still working on choosing items with sugar in single digits on nutrition facts label. Pt states she has been avoiding alcohol. Pt states she drinks 64+ ounces of fluid a day. Pt states she has been reducing caffeine intake by mixing half and half caffenaited with decaf coffee; reports this will be hard. Pt has increased meal intake to 3 meals a day. Pt has been making great behavioral changes and being proactive."   Goals:  - Aim for 150 minutes of physical activity including cardio and weight bearing every week - Try 1/2 flavor pack for 1 bottle of water.  - Continue to reduce caffeine intake.  - Try to not drink 15 minutes before eating, not while eating, and waiting 30 minutes after eating to drink.   She plans to stop smoking this month. She has nicotine patch and gum.  She has great support network at home.  She plans to have  sleeve gastrectomy. Pre-op nutrition class Apr 29.   Care gaps: PAP due in 01/2019  has a past medical history of Depression.  family history includes Diabetes in her other; Hypertension in her other.  Review of Systems  Constitutional: Negative for diaphoresis and fatigue.  Cardiovascular: Negative for chest pain and palpitations.  Neurological: Negative for dizziness and headaches.  Psychiatric/Behavioral: Positive for behavioral problems and decreased concentration.    Patient Active Problem List   Diagnosis Date Noted  . Presence of of 52 mg levonorgestrel-releasing intrauterine device (IUD) 02/22/2016    Current Outpatient Medications on File Prior to Visit  Medication Sig Dispense Refill  . ADDERALL XR 20 MG 24 hr capsule TAKE 1 CAPSULE BY MOUTH EVERY DAY IN THE MORNING 30 capsule 0  . levonorgestrel (MIRENA) 20 MCG/24HR IUD 1 each by Intrauterine route once.    . venlafaxine XR (EFFEXOR-XR) 150 MG 24 hr capsule Take 1 capsule (150 mg total) by mouth daily. 30 capsule 3  . amphetamine-dextroamphetamine (ADDERALL XR) 20 MG 24 hr capsule Take 1 capsule (20 mg total) by mouth every morning. 30 capsule 0  . amphetamine-dextroamphetamine (ADDERALL XR) 20 MG 24 hr capsule Take 1 capsule (20 mg total) by mouth every morning. 30 capsule 0   No current facility-administered medications on file prior to visit.     No Known Allergies   Objective:  BP 126/82   Pulse (!) 118  Temp 98.7 F (37.1 C) (Oral)   Resp 18   Ht 5\' 1"  (1.549 m)   Wt 223 lb 9.6 oz (101.4 kg)   SpO2 96%   BMI 42.25 kg/m   Physical Exam  Constitutional: She is oriented to person, place, and time. No distress.  obese  Cardiovascular: Regular rhythm and normal heart sounds. Tachycardia present.  Neurological: She is alert and oriented to person, place, and time.  Skin: Skin is warm and dry.  Psychiatric: Judgment normal.  Vitals reviewed.   Assessment and Plan :  1. Episode of recurrent major depressive disorder, unspecified depression episode  severity (HCC) - venlafaxine XR (EFFEXOR-XR) 150 MG 24 hr capsule; Take 1 capsule (150 mg total) by mouth daily.  Dispense: 30 capsule; Refill: 3 - Controlled with medication and improving with recent lifestyle changes. RTC in 6 months. Needs PAP 01/2019 2. Attention deficit disorder (ADD) without hyperactivity - amphetamine-dextroamphetamine (ADDERALL XR) 20 MG 24 hr capsule; Take 1 capsule (20 mg total) by mouth every morning.  Dispense: 30 capsule; Refill: 0 - Pt doing well on this dose. OK to refill 3 months. OK to refill electronically without OV in 3 months.   3. Tachycardia - Will monitor. Consider beta blocker next OV.  4. Severe sleep apnea - Recently started CPAP and has noticed major improvements. She plans to con't using CPAP.  5. Class 3 severe obesity with body mass index (BMI) of 40.0 to 44.9 in adult, unspecified obesity type, unspecified whether serious comorbidity present [facility](HCC) - She is enjoying classes with nutritionist and is working on lifestyle changes.  "My goal for my [age] birthday is to be healthier than my 30th". She plans to stop smoking this month. She has nicotine patch and gum. She plans to have planned sleeve gastrectomy. Pre-op nutrition class Apr 29.   Marco CollieWhitney Keiona Jenison, PA-C  [facility] at [facility] 03/23/2018 10:09 AM

## 2018-03-29 ENCOUNTER — Encounter: Payer: BLUE CROSS/BLUE SHIELD | Attending: Surgery | Admitting: Skilled Nursing Facility1

## 2018-03-29 DIAGNOSIS — Z6841 Body Mass Index (BMI) 40.0 and over, adult: Secondary | ICD-10-CM

## 2018-03-29 DIAGNOSIS — Z713 Dietary counseling and surveillance: Secondary | ICD-10-CM | POA: Insufficient documentation

## 2018-03-31 ENCOUNTER — Encounter: Payer: Self-pay | Admitting: Skilled Nursing Facility1

## 2018-03-31 NOTE — Progress Notes (Signed)
Pre-Operative Nutrition Class:  Appt start time: 2876   End time:  1830.  Patient was seen on 03/29/2018 for Pre-Operative Bariatric Surgery Education at [facility].   Surgery date:  Surgery type: Sleeve Start weight at [facility]: 225.5 Weight today: 222.7  Samples given per MNT protocol. Patient educated on appropriate usage: Procare Multivitamin Lot #  [id number] Exp: 06/2019  Renee Pain Protein  Shake Lot # [id number] Exp: 07/march/2020 The following the learning objectives were met by the patient during this course:  Identify Pre-Op Dietary Goals and will begin 2 weeks pre-operatively  Identify appropriate sources of fluids and proteins   State protein recommendations and appropriate sources pre and post-operatively  Identify Post-Operative Dietary Goals and will follow for 2 weeks post-operatively  Identify appropriate multivitamin and calcium sources  Describe the need for physical activity post-operatively and will follow MD recommendations  State when to call healthcare provider regarding medication questions or post-operative complications  Handouts given during class include:  Pre-Op Bariatric Surgery Diet Handout  Protein Shake Handout  Post-Op Bariatric Surgery Nutrition Handout  BELT Program Information Flyer  Support Group Information Flyer  [facility] Bariatric Supplements Price List  Follow-Up Plan: Patient will follow-up at [facility] 2 weeks post operatively for diet advancement per MD.

## 2018-04-16 DIAGNOSIS — G4733 Obstructive sleep apnea (adult) (pediatric): Secondary | ICD-10-CM | POA: Diagnosis not present

## 2018-04-26 DIAGNOSIS — S62659A Nondisplaced fracture of medial phalanx of unspecified finger, initial encounter for closed fracture: Secondary | ICD-10-CM | POA: Diagnosis not present

## 2018-04-30 DIAGNOSIS — S62655A Nondisplaced fracture of medial phalanx of left ring finger, initial encounter for closed fracture: Secondary | ICD-10-CM | POA: Diagnosis not present

## 2018-04-30 DIAGNOSIS — S62633A Displaced fracture of distal phalanx of left middle finger, initial encounter for closed fracture: Secondary | ICD-10-CM | POA: Diagnosis not present

## 2018-04-30 DIAGNOSIS — S62639A Displaced fracture of distal phalanx of unspecified finger, initial encounter for closed fracture: Secondary | ICD-10-CM | POA: Insufficient documentation

## 2018-04-30 DIAGNOSIS — M79645 Pain in left finger(s): Secondary | ICD-10-CM | POA: Diagnosis not present

## 2018-04-30 DIAGNOSIS — S62629A Displaced fracture of medial phalanx of unspecified finger, initial encounter for closed fracture: Secondary | ICD-10-CM | POA: Insufficient documentation

## 2018-05-11 ENCOUNTER — Encounter: Payer: Self-pay | Admitting: Neurology

## 2018-05-11 ENCOUNTER — Ambulatory Visit: Payer: BLUE CROSS/BLUE SHIELD | Admitting: Neurology

## 2018-05-11 VITALS — BP 124/86 | HR 88 | Ht 61.0 in | Wt 215.0 lb

## 2018-05-11 DIAGNOSIS — Z9989 Dependence on other enabling machines and devices: Secondary | ICD-10-CM | POA: Diagnosis not present

## 2018-05-11 DIAGNOSIS — G4734 Idiopathic sleep related nonobstructive alveolar hypoventilation: Secondary | ICD-10-CM | POA: Diagnosis not present

## 2018-05-11 DIAGNOSIS — G4733 Obstructive sleep apnea (adult) (pediatric): Secondary | ICD-10-CM | POA: Diagnosis not present

## 2018-05-11 NOTE — Progress Notes (Signed)
Subjective:    Patient ID: Debbie White is a 37 y.o. female.  HPI     Interim history:   Debbie White is a 37 year old right-handed woman with an underlying medical history of depression, ADD, smoking and obesity, who presents for follow-up consultation of her obstructive sleep apnea, after recent conflict testing. The patient is unaccompanied today. I first met her on 01/27/2018 at the request of her primary care PA, at which time she reported snoring, daytime somnolence as well as witnessed apneas. She was advised to proceed with a sleep study. Her insurance denied a lab attended sleep study. She had a home sleep test on 02/17/2018 which indicated severe sleep apnea with an AHI of 76 per hour, average oxygen saturation of 87%, nadir of 70% or below. Despite the severity of her sleep apnea, she was not approved for a lab attended sleep study and was advised to proceed with AutoPap therapy at home.  Today, 05/11/2018 (all dictated new, as well as above notes, some dictation done in note pad or Word, outside of chart, may appear as copied):  I reviewed her AutoPap compliance data from 04/10/2018 through 05/09/2014 which is a total of 30 days, during which time she used her AutoPap every night with percent used days greater than 4 hours at 9 hours and 28 minutes, residual AHI suboptimal 9 per hour, mostly secondary to obstructive events, 95th percentile pressure at 15.8 cm, leak acceptable with the 95th percentile at 11.9 L/m on a pressure range of 7 cm to 16 cm with EPR. She reports overall doing well but would like to see if she can get a smaller amount. She has a full facemask but it reaches up to her eyes. She feels improved as far as her sleep quality, sleep consolidation and daytime somnolence. She feels that she is more sharper in her cognitive function during the day. She stopped smoking cigarettes some 2 months ago but is using nicotine vapor. She is weaning off of it. She has had some weight  loss success so far, almost in the realm of 10 pounds in the past 4 months which is also very positive. Unfortunately, she fell recently and broke 2 fingers on the left hand, she sees orthopedics for this.  The patient's allergies, current medications, family history, past medical history, past social history, past surgical history and problem list were reviewed and updated as appropriate.   Previously (copied from previous notes for reference):   01/27/2018: (She) reports snoring and excessive daytime somnolence as well as witnessed breathing pauses while asleep. She has had enuresis. I reviewed your office note from 12/23/2017. Her Epworth sleepiness score is 17 out of 24, fatigue score is 48 out of 63. She lives with her boyfriend and her daughter. She works at a [employer], technical sales. She smokes about 2 cigarettes daily, drinks alcohol occasionally, drinks caffeine in the form of soda, 3-4 cans per day on average. She drinks coffee in the morning, typically 1 cup. Rise time is around 6 AM and she has to take her daughter to school, etc. one hour drive, in the afternoon it gets to be very difficult for her to stay awake while driving. She works part-time, typically from 9:30 AM to 1:30 PM on Mondays, Wednesdays, and Fridays, bedtime is around 10:30 or 11. She has nocturia about twice per average night, has had the occasional morning headache, and in the past 2 months or so she has had worsening snoring and witnessed apneas as well  as enuresis a few times. She has gained weight in the past 2-3 years. She has been on Adderall long-acting for about 10 years and Effexor long-acting for about 10 years. She has with time reduced her Adderall dose. She has no obvious family history of obstructive sleep apnea but does not know much about her father's medical history. She has had occasional restless leg symptoms, more so during her pregnancies. She has a 13 year old daughter and a 83 year old daughter. She has had  occasional sleep talking, yelling out in her sleep and movements. She does not recall her dreams typically.  Her Past Medical History Is Significant For: Past Medical History:  Diagnosis Date  . Depression     Her Past Surgical History Is Significant For: No past surgical history on file.  Her Family History Is Significant For: Family History  Problem Relation Age of Onset  . Diabetes Other   . Hypertension Other   . Stroke Maternal Grandfather     Her Social History Is Significant For: Social History   Socioeconomic History  . Marital status: Single    Spouse name: Not on file  . Number of children: Not on file  . Years of education: Not on file  . Highest education level: Not on file  Occupational History  . Not on file  Social Needs  . Financial resource strain: Not on file  . Food insecurity:    Worry: Never true    Inability: Never true  . Transportation needs:    Medical: Not on file    Non-medical: Not on file  Tobacco Use  . Smoking status: Current Every Day Smoker  . Smokeless tobacco: Never Used  Substance and Sexual Activity  . Alcohol use: Yes    Comment: occas  . Drug use: No  . Sexual activity: Not on file  Lifestyle  . Physical activity:    Days per week: Not on file    Minutes per session: Not on file  . Stress: Not on file  Relationships  . Social connections:    Talks on phone: Not on file    Gets together: Not on file    Attends religious service: Not on file    Active member of club or organization: Not on file    Attends meetings of clubs or organizations: Not on file    Relationship status: Not on file  Other Topics Concern  . Not on file  Social History Narrative  . Not on file    Her Allergies Are:  No Known Allergies:   Her Current Medications Are:  Outpatient Encounter Medications as of 05/11/2018  Medication Sig  . amphetamine-dextroamphetamine (ADDERALL XR) 20 MG 24 hr capsule Take 1 capsule (20 mg total) by mouth every  morning.  Marland Kitchen levonorgestrel (MIRENA) 20 MCG/24HR IUD 1 each by Intrauterine route once.  . venlafaxine XR (EFFEXOR-XR) 150 MG 24 hr capsule Take 1 capsule (150 mg total) by mouth daily.  . [DISCONTINUED] ADDERALL XR 20 MG 24 hr capsule TAKE 1 CAPSULE BY MOUTH EVERY DAY IN THE MORNING  . [DISCONTINUED] amphetamine-dextroamphetamine (ADDERALL XR) 20 MG 24 hr capsule Take 1 capsule (20 mg total) by mouth every morning.   No facility-administered encounter medications on file as of 05/11/2018.   :  Review of Systems:  Out of a complete 14 point review of systems, all are reviewed and negative with the exception of these symptoms as listed below: Review of Systems  Neurological:  Patient reports that her mask may be a little too big and would like a smaller one.     Objective:  Neurological Exam  Physical Exam Physical Examination:   Vitals:   05/11/18 1021  BP: 124/86  Pulse: 88   General Examination: The patient is a very pleasant 37 y.o. female in no acute distress. She appears well-developed and well-nourished and well groomed.   HEENT: Normocephalic, atraumatic, pupils are equal, round and reactive to light and accommodation. Extraocular tracking is good without limitation to gaze excursion or nystagmus noted. Normal smooth pursuit is noted. Hearing is grossly intact. Face is symmetric with normal facial animation and normal facial sensation. Speech is clear with no dysarthria noted. There is no hypophonia. There is no lip, neck/head, jaw or voice tremor. Neck is supple with FROM. Oropharynx exam reveals: mild mouth dryness, adequate dental hygiene and marked airway crowding, due to smaller airway entry. Tongue protrudes centrally and palate elevates symmetrically.  Chest: Clear to auscultation without wheezing, rhonchi or crackles noted.  Heart: S1+S2+0, regular and normal without murmurs, rubs or gallops noted.   Abdomen: Soft, non-tender and non-distended with normal bowel  sounds appreciated on auscultation.  Extremities: There is no pitting edema in the distal lower extremities bilaterally. Pedal pulses are intact.  Skin: Warm and dry without trophic changes noted.  Musculoskeletal: exam reveals no obvious joint deformities, tenderness or joint swelling or erythema With the exception of finger braces on digits 3 and 4 on the left hand.   Neurologically:  Mental status: The patient is awake, alert and oriented in all 4 spheres. Her immediate and remote memory, attention, language skills and fund of knowledge are appropriate. There is no evidence of aphasia, agnosia, apraxia or anomia. Speech is clear with normal prosody and enunciation. Thought process is linear. Mood is normal and affect is normal.  Cranial nerves II - XII are as described above under HEENT exam.  Motor exam: Normal bulk, strength and tone is noted. There is no tremor or rebound. Romberg is negative.  Fine motor skills and coordination: grossly intact, limited hand movements on the left hand.  Cerebellar testing: No dysmetria or intention tremor. There is no truncal or gait ataxia.  Sensory exam: intact to light touch in the upper and lower extremities.  Gait, station and balance: She stands easily. No veering to one side is noted. No leaning to one side is noted. Posture is age-appropriate and stance is narrow based. Gait shows normal stride length and normal pace. No problems turning are noted. Tandem walk is unremarkable.      Assessment and Plan:    In summary, Debbie White is a very pleasant 37 year old female with an underlying medical history of depression, ADD, smoking and obesity, who presents for follow up consultation of her obstructive sleep apnea (OSA). Her home sleep test from March 2019 indicated severe OSA, with severe desaturations and significant time below 89% saturation of over 3 hours. She is advised about her test results. She is reminded to work on nicotine cessation  and aggressively. Thankfully, she has adapted well to AutoPap therapy and has benefited from it, she feels better rested, sleep with better quality. She has also been able to lose some weight and thus far. She is no longer smoking any cigarettes in the past 2 months for which she is commended. I would like to change her AutoPap to CPAP of 16 cm. She will benefit from a mask refit as the current fullface  mask seems to large. She has some discomfort with it. Given her severe desaturations noted during the home sleep test I will also request an overnight pulse oximetry test while she is using her AutoPap or CPAP of 16 cm. Ideally, we will do this once she has been changed to CPAP and has had a better fitting mask. We will call her with the ONO results. If all goes well she can follow-up in 6 months, she can see one of our nurse practitioners next time. She is highly commended for her superb treatment adherence with AutoPap therapy. She is encouraged to work on weight loss and nicotine cessation. I answered all her questions today and she was in agreement. I spent 30 minutes in total face-to-face time with the patient, more than 50% of which was spent in counseling and coordination of care, reviewing test results, reviewing medication and discussing or reviewing the diagnosis of OSA, its prognosis and treatment options. Pertinent laboratory and imaging test results that were available during this visit with the patient were reviewed by me and considered in my medical decision making (see chart for details).

## 2018-05-11 NOTE — Progress Notes (Signed)
Orders for ONO, mask refit, and pressure change faxed to [facility]. Received a receipt of confirmation.

## 2018-05-11 NOTE — Patient Instructions (Signed)
Please continue using your autoPAP regularly. While your insurance requires that you use PAP at least 4 hours each night on 70% of the nights, I recommend, that you not skip any nights and use it throughout the night if you can. Getting used to PAP and staying with the treatment long term does take time and patience and discipline. Untreated obstructive sleep apnea when it is moderate to severe can have an adverse impact on cardiovascular health and raise her risk for heart disease, arrhythmias, hypertension, congestive heart failure, stroke and diabetes. Untreated obstructive sleep apnea causes sleep disruption, nonrestorative sleep, and sleep deprivation. This can have an impact on your day to day functioning and cause daytime sleepiness and impairment of cognitive function, memory loss, mood disturbance, and problems focussing. Using PAP regularly can improve these symptoms. Great job with your autoPAP thus far! Keep up the good work! We can see you in 6 months, you can see one of our nurse practitioners as you are stable.  As discussed we will change you from autoPAP to CPAP of 16 cm with a 30 min ramp time, I will request a mask refit as well. As discussed, we will do an overnight oxygen level test, called ONO, and your DME company will call and set this up for one night, while you also use your autoPAP as usual. We will call you with the results. This is to make sure that your oxygen levels stay in the 90s, while you are treated with autoPAP for your OSA. Remember, your oxygen levels dropped into the 70s during the home sleep test.

## 2018-05-14 DIAGNOSIS — S62633A Displaced fracture of distal phalanx of left middle finger, initial encounter for closed fracture: Secondary | ICD-10-CM | POA: Diagnosis not present

## 2018-05-14 DIAGNOSIS — J449 Chronic obstructive pulmonary disease, unspecified: Secondary | ICD-10-CM | POA: Diagnosis not present

## 2018-05-14 DIAGNOSIS — S62655A Nondisplaced fracture of medial phalanx of left ring finger, initial encounter for closed fracture: Secondary | ICD-10-CM | POA: Diagnosis not present

## 2018-05-14 DIAGNOSIS — R0902 Hypoxemia: Secondary | ICD-10-CM | POA: Diagnosis not present

## 2018-05-15 ENCOUNTER — Encounter: Payer: Self-pay | Admitting: Neurology

## 2018-05-17 DIAGNOSIS — G4733 Obstructive sleep apnea (adult) (pediatric): Secondary | ICD-10-CM | POA: Diagnosis not present

## 2018-05-20 ENCOUNTER — Telehealth: Payer: Self-pay | Admitting: Neurology

## 2018-05-20 DIAGNOSIS — G4733 Obstructive sleep apnea (adult) (pediatric): Secondary | ICD-10-CM

## 2018-05-20 DIAGNOSIS — Z9989 Dependence on other enabling machines and devices: Secondary | ICD-10-CM

## 2018-05-20 DIAGNOSIS — G4734 Idiopathic sleep related nonobstructive alveolar hypoventilation: Secondary | ICD-10-CM

## 2018-05-20 NOTE — Telephone Encounter (Signed)
I reviewed patient's overnight pulse oximetry test from 05/14/2018 while patient was using her AutoPap, total test time was 8 hours and 6 seconds. Average oxygen saturation was 94%, nadir was 77% with time below or at 88% saturation of 5 minutes and 46 seconds. This indicates that she still has desaturations as low as 77% while she is compliant with her AutoPap. I will request insurance authorization for a proper overnight sleep study for CPAP titration and optimalization of her treatment settings. Pls notify patient.

## 2018-05-21 NOTE — Telephone Encounter (Signed)
I called pt to discuss her ONO results. No answer, left a message asking her to call me back. 

## 2018-05-25 NOTE — Telephone Encounter (Signed)
I called pt to discuss. No answer, left a message asking her to call me back. 

## 2018-05-26 NOTE — Telephone Encounter (Signed)
I called pt again to discuss her ONO results, no answer, left a message asking her to call me back. This is my third unsuccessful attempt at reaching pt by phone, will send her a mychart message.

## 2018-06-01 ENCOUNTER — Ambulatory Visit (INDEPENDENT_AMBULATORY_CARE_PROVIDER_SITE_OTHER): Payer: BLUE CROSS/BLUE SHIELD | Admitting: Neurology

## 2018-06-01 DIAGNOSIS — G4733 Obstructive sleep apnea (adult) (pediatric): Secondary | ICD-10-CM | POA: Diagnosis not present

## 2018-06-01 DIAGNOSIS — Z9989 Dependence on other enabling machines and devices: Secondary | ICD-10-CM

## 2018-06-01 DIAGNOSIS — G4720 Circadian rhythm sleep disorder, unspecified type: Secondary | ICD-10-CM

## 2018-06-01 DIAGNOSIS — S62655A Nondisplaced fracture of medial phalanx of left ring finger, initial encounter for closed fracture: Secondary | ICD-10-CM | POA: Diagnosis not present

## 2018-06-01 DIAGNOSIS — G4734 Idiopathic sleep related nonobstructive alveolar hypoventilation: Secondary | ICD-10-CM

## 2018-06-01 DIAGNOSIS — S62633A Displaced fracture of distal phalanx of left middle finger, initial encounter for closed fracture: Secondary | ICD-10-CM | POA: Diagnosis not present

## 2018-06-02 ENCOUNTER — Encounter: Payer: Self-pay | Admitting: Physician Assistant

## 2018-06-07 ENCOUNTER — Encounter: Payer: Self-pay | Admitting: Physician Assistant

## 2018-06-08 ENCOUNTER — Encounter: Payer: Self-pay | Admitting: Physician Assistant

## 2018-06-09 ENCOUNTER — Other Ambulatory Visit: Payer: Self-pay | Admitting: Physician Assistant

## 2018-06-09 DIAGNOSIS — F988 Other specified behavioral and emotional disorders with onset usually occurring in childhood and adolescence: Secondary | ICD-10-CM

## 2018-06-09 NOTE — Telephone Encounter (Signed)
Adderall refill. Current prescription expired. Pt states that insurance will only cover the brand name Last Refill:03/23/18 #30 Last OV: 03/23/18 PCP: Alphonzo LemmingsWhitney McVey,PA Pharmacy:[facility] on [address]

## 2018-06-09 NOTE — Telephone Encounter (Signed)
Copied from CRM [id number]. Topic: Quick Communication - Rx Refill/Question >> Jun 09, 2018  3:32 PM Laural BenesJohnson, Louisianahiquita C wrote: Medication: ADDERALL XR) 20 MG 24 hr capsule --- Pt says that her insurance will only cover the brand name   Has the patient contacted their pharmacy? Yes   (Agent: If no, request that the patient contact the pharmacy for the refill.) (Agent: If yes, when and what did the pharmacy advise?)  Preferred Pharmacy (with phone number or street name): [facility] on [address]   Agent: Please be advised that RX refills may take up to 3 business days. We ask that you follow-up with your pharmacy.

## 2018-06-10 MED ORDER — AMPHETAMINE-DEXTROAMPHET ER 20 MG PO CP24: 20.0000 mg | ORAL_CAPSULE | ORAL | 0 refills | Status: DC

## 2018-06-10 NOTE — Telephone Encounter (Signed)
Adderall rx had end date of 05/22/2018 - expired.  [facility]

## 2018-06-15 ENCOUNTER — Encounter: Payer: Self-pay | Admitting: Neurology

## 2018-06-15 DIAGNOSIS — S62633A Displaced fracture of distal phalanx of left middle finger, initial encounter for closed fracture: Secondary | ICD-10-CM | POA: Diagnosis not present

## 2018-06-15 DIAGNOSIS — S62655A Nondisplaced fracture of medial phalanx of left ring finger, initial encounter for closed fracture: Secondary | ICD-10-CM | POA: Diagnosis not present

## 2018-06-15 NOTE — Procedures (Signed)
PATIENT'S NAME:  Debbie White, Debbie White DOB:      03/10/1981      MR#:    [id number]     DATE OF RECORDING: 06/01/2018 REFERRING M.D.:  Madelaine Bhat McVey PA-C Study Performed:   CPAP  Titration HISTORY: 37 year old woman with a history of depression, ADD, smoking and obesity, who presents for a full night PAP titration study. She has been diagnosed with severe OSA and autoPAP therapy is not adequate. She had a recent abnormal ONO. The patient endorsed the Epworth Sleepiness Scale at 17 points, BMI of 40.8 kg/m2. The patient's neck circumference measured 16.5 inches.  CURRENT MEDICATIONS: Adderall, Mirena, Effexor  PROCEDURE:  This is a multichannel digital polysomnogram utilizing the SomnoStar 11.2 system.  Electrodes and sensors were applied and monitored per AASM Specifications.   EEG, EOG, Chin and Limb EMG, were sampled at 200 Hz.  ECG, Snore and Nasal Pressure, Thermal Airflow, Respiratory Effort, CPAP Flow and Pressure, Oximetry was sampled at 50 Hz. Digital video and audio were recorded.      The patient was started on CPAP and titrated from 5 cmH20 with heated humidity per AASM split night standards to a pressure of 15 cmH20. She req uired a higher pressure due to hypopneas, apneas and desaturations. She was switched to BiPAP of 17/13 cm and further titrated to 22/18 cm. On a BiPAP pressure of 21/17 cmH20, there was a reduction of the AHI to 0/hour with supine NREM sleep achieved and O2 nadir of 89%.   Lights Out was at 22:54 and Lights On at 05:03. Total recording time (TRT) was 370 minutes, with a total sleep time (TST) of 207.5 minutes. The patient's sleep latency was 174 minutes, which is markedly delayed. REM sleep was absent. The sleep efficiency was 56.1 %, which is reduced.    SLEEP ARCHITECTURE: WASO (Wake after sleep onset)  was 32 minutes with moderate to severe sleep fragmentation noted. There were 45 minutes in Stage N1, 65.5 minutes Stage N2, 97 minutes Stage N3 and 0 minutes in  Stage REM.  The percentage of Stage N1 was 21.7%, which is markedly increased, stage N2 was 31.6%, Stage N3 was 46.7%, which is increased, and Stage R (REM sleep) was absent. The arousals were noted as: 76 were spontaneous, 0 were associated with PLMs, 14 were associated with respiratory events.  RESPIRATORY ANALYSIS:  There was a total of 14 respiratory events: 0 obstructive apneas, 0 central apneas and 0 mixed apneas with a total of 0 apneas and an apnea index (AI) of 0 /hour. There were 14 hypopneas with a hypopnea index of 4./hour. The patient also had 0 respiratory event related arousals (RERAs).      The total APNEA/HYPOPNEA INDEX  (AHI) was 4.0. /hour and the total RESPIRATORY DISTURBANCE INDEX was 4. 0./hour  0 events occurred in REM sleep and 14 events in NREM. The REM AHI was 0 /hour versus a non-REM AHI of 4. 0./hour.  The patient spent 121.5 minutes of total sleep time in the supine position and 86 minutes in non-supine. The supine AHI was 6.4, versus a non-supine AHI of 0.7.  OXYGEN SATURATION & C02:  The baseline 02 saturation was 98%, with the lowest being 86%. Time spent below 89% saturation equaled 2 minutes.  PERIODIC LIMB MOVEMENTS:  The patient had a total of 0 Periodic Limb Movements. The Periodic Limb Movement (PLM) index was 0 and the PLM Arousal index was 0 /hour.  Audio and video analysis did not show any  abnormal or unusual movements, behaviors, phonations or vocalizations. Bruxism was noted. The patient took 1 bathroom break. The EKG was in keeping with normal sinus rhythm (NSR).   Post-study, the patient indicated that sleep was the same as usual.   DIAGNOSIS:  1. Obstructive Sleep Apnea  2. Dysfunctions associated with sleep stages or arousals from sleep  RECOMMENDATIONS:   1. This study demonstrates significant improvement of the patient's severe obstructive sleep apnea with BiPAP therapy. The study was limited by absence of REM sleep and reduced sleep efficiency,  delay in sleep onset. Nevertheless, I will change her home PAP treatment modality to standard BiPAP of 21/17. The patient should be reminded to be fully compliant with PAP therapy to improve sleep related symptoms and decrease long term cardiovascular risks. The patient should be reminded, that it may take up to 3 months to get fully used to using PAP with all planned sleep. The earlier full compliance is achieved, the better long term compliance tends to be. Please note that untreated obstructive sleep apnea may carry additional perioperative morbidity. Patients with significant obstructive sleep apnea should receive perioperative PAP therapy and the surgeons and particularly the anesthesiologist should be informed of the diagnosis and the severity of the sleep disordered breathing. 2. This study shows sleep fragmentation and abnormal sleep stage percentages; these are nonspecific findings and per se do not signify an intrinsic sleep disorder or a cause for the patient's sleep-related symptoms. Causes include (but are not limited to) the first night effect of the sleep study, circadian rhythm disturbances, medication effect or an underlying mood disorder or medical problem.  3. The patient should be cautioned not to drive, work at heights, or operate dangerous or heavy equipment when tired or sleepy. Review and reiteration of good sleep hygiene measures should be pursued with any patient. 4. The patient will be seen in [facility] for discussion of the test results, symptom and treatment compliance review, further management strategies, etc. The referring provider will be notified of the test results.   I certify that I have reviewed the entire raw data recording prior to the issuance of this report in accordance with the Standards of Accreditation of the American Academy of Sleep Medicine (AASM)   Huston FoleySaima Christorpher Hisaw, MD, PhD Diplomat, American Board of Psychiatry and Neurology (Neurology  and Sleep Medicine)

## 2018-06-15 NOTE — Progress Notes (Signed)
Patient had HST 02/17/18, which showed severe OSA, started autoPAP, then had abn ONO. Patient had a CPAP titration study on 06/01/18:  Please call and inform patient that I have entered an order for treatment with positive airway pressure (PAP) treatment for obstructive sleep apnea (OSA). She did well during the latest sleep study with BiPAP. We will, therefore, change her home treatment to BiPAP. She will need a FU in 2-3 months. May see one of our nurse practitioners if needed for proper timing of the FU appointment. Please fax or rout report to the referring provider. Thanks,   Huston FoleySaima Noralee Dutko, MD, PhD [facility])

## 2018-06-15 NOTE — Addendum Note (Signed)
Addended by: Huston FoleyATHAR, Jesua Tamblyn on: 06/15/2018 05:23 PM   Modules accepted: Orders

## 2018-06-16 ENCOUNTER — Other Ambulatory Visit: Payer: Self-pay | Admitting: Physician Assistant

## 2018-06-16 ENCOUNTER — Telehealth: Payer: Self-pay

## 2018-06-16 DIAGNOSIS — M79642 Pain in left hand: Secondary | ICD-10-CM | POA: Diagnosis not present

## 2018-06-16 DIAGNOSIS — F988 Other specified behavioral and emotional disorders with onset usually occurring in childhood and adolescence: Secondary | ICD-10-CM

## 2018-06-16 MED ORDER — AMPHETAMINE-DEXTROAMPHET ER 20 MG PO CP24: 20.0000 mg | ORAL_CAPSULE | ORAL | 0 refills | Status: DC

## 2018-06-16 NOTE — Telephone Encounter (Signed)
I called pt. I advised pt that Dr. Frances FurbishAthar reviewed their sleep study results and found that pt did well with a bipap during her latest sleep study. Dr. Frances FurbishAthar recommends that pt star a bipap at home. I reviewed PAP compliance expectations with the pt. Pt is agreeable to starting a BiPAP. I advised pt that an order will be sent to a DME, [facility], and [facility] will call the pt within about one week after they file with the pt's insurance. [facility] will show the pt how to use the machine, fit for masks, and troubleshoot the BiPAP if needed. A follow up appt was made for insurance purposes with Dr. Frances FurbishAthar on 09/21/2018 at 11:30am. Pt verbalized understanding to arrive 15 minutes early and bring their BiPAP. A letter with all of this information in it will be sent to the pt's mychart account as a reminder. I verified with the pt that the address we have on file is correct. Pt verbalized understanding of results. Pt had no questions at this time but was encouraged to call back if questions arise.

## 2018-06-16 NOTE — Telephone Encounter (Signed)
-----   Message from Huston FoleySaima Athar, MD sent at 06/15/2018  5:23 PM EDT ----- Patient had HST 02/17/18, which showed severe OSA, started autoPAP, then had abn ONO. Patient had a CPAP titration study on 06/01/18:  Please call and inform patient that I have entered an order for treatment with positive airway pressure (PAP) treatment for obstructive sleep apnea (OSA). She did well during the latest sleep study with BiPAP. We will, therefore, change her home treatment to BiPAP. She will need a FU in 2-3 months. May see one of our nurse practitioners if needed for proper timing of the FU appointment. Please fax or rout report to the referring provider. Thanks,   Huston FoleySaima Athar, MD, PhD [facility])

## 2018-06-17 DIAGNOSIS — G4733 Obstructive sleep apnea (adult) (pediatric): Secondary | ICD-10-CM | POA: Diagnosis not present

## 2018-06-30 DIAGNOSIS — M79642 Pain in left hand: Secondary | ICD-10-CM | POA: Diagnosis not present

## 2018-07-13 DIAGNOSIS — M79642 Pain in left hand: Secondary | ICD-10-CM | POA: Diagnosis not present

## 2018-07-13 DIAGNOSIS — S62655A Nondisplaced fracture of medial phalanx of left ring finger, initial encounter for closed fracture: Secondary | ICD-10-CM | POA: Diagnosis not present

## 2018-07-13 DIAGNOSIS — S62633A Displaced fracture of distal phalanx of left middle finger, initial encounter for closed fracture: Secondary | ICD-10-CM | POA: Diagnosis not present

## 2018-07-18 DIAGNOSIS — G4733 Obstructive sleep apnea (adult) (pediatric): Secondary | ICD-10-CM | POA: Diagnosis not present

## 2018-08-10 DIAGNOSIS — S62633D Displaced fracture of distal phalanx of left middle finger, subsequent encounter for fracture with routine healing: Secondary | ICD-10-CM | POA: Diagnosis not present

## 2018-08-10 DIAGNOSIS — S62655D Nondisplaced fracture of medial phalanx of left ring finger, subsequent encounter for fracture with routine healing: Secondary | ICD-10-CM | POA: Diagnosis not present

## 2018-08-18 ENCOUNTER — Other Ambulatory Visit: Payer: Self-pay | Admitting: Physician Assistant

## 2018-08-18 DIAGNOSIS — F339 Major depressive disorder, recurrent, unspecified: Secondary | ICD-10-CM

## 2018-08-18 DIAGNOSIS — G4733 Obstructive sleep apnea (adult) (pediatric): Secondary | ICD-10-CM | POA: Diagnosis not present

## 2018-09-17 DIAGNOSIS — G4733 Obstructive sleep apnea (adult) (pediatric): Secondary | ICD-10-CM | POA: Diagnosis not present

## 2018-09-21 ENCOUNTER — Ambulatory Visit: Payer: Self-pay | Admitting: Neurology

## 2018-09-21 ENCOUNTER — Other Ambulatory Visit: Payer: Self-pay

## 2018-09-21 ENCOUNTER — Encounter: Payer: Self-pay | Admitting: Physician Assistant

## 2018-09-21 ENCOUNTER — Ambulatory Visit: Payer: BLUE CROSS/BLUE SHIELD | Admitting: Physician Assistant

## 2018-09-21 VITALS — BP 110/80 | HR 101 | Temp 99.2°F | Resp 16 | Ht 62.0 in | Wt 212.0 lb

## 2018-09-21 DIAGNOSIS — Z6841 Body Mass Index (BMI) 40.0 and over, adult: Secondary | ICD-10-CM

## 2018-09-21 DIAGNOSIS — G4730 Sleep apnea, unspecified: Secondary | ICD-10-CM

## 2018-09-21 DIAGNOSIS — F339 Major depressive disorder, recurrent, unspecified: Secondary | ICD-10-CM

## 2018-09-21 DIAGNOSIS — F988 Other specified behavioral and emotional disorders with onset usually occurring in childhood and adolescence: Secondary | ICD-10-CM

## 2018-09-21 MED ORDER — AMPHETAMINE-DEXTROAMPHET ER 20 MG PO CP24: 20.0000 mg | ORAL_CAPSULE | ORAL | 0 refills | Status: DC

## 2018-09-21 MED ORDER — AMPHETAMINE-DEXTROAMPHET ER 20 MG PO CP24: 20.0000 mg | ORAL_CAPSULE | Freq: Every day | ORAL | 0 refills | Status: DC

## 2018-09-21 MED ORDER — VENLAFAXINE HCL ER 150 MG PO CP24: 150.0000 mg | ORAL_CAPSULE | Freq: Every day | ORAL | 3 refills | Status: DC

## 2018-09-21 NOTE — Progress Notes (Signed)
Debbie White  MRN: [id number] DOB: [birth year]  PCP: Sebastian Ache, PA-C  Subjective:  Pt is a 37 year old female who presents to clinic for medication f/u Adderall.  Last OV 6 months ago.   Adderall XR 20mg  qd x >10 years. Dose is working well. she takes this once daily in the morning. Takes this every day, with occasional skipped doses on the weekends.   Depression x 10 years - Effexor XR 150mg  qd. Her 70 yo daughter is a major source of stress in her life. "she is not making good choices and is hanging out with the wrong crowd"  Severe OSA - on CPAP O2 sats were still 70% (per pt). she is now on BiPAP. Having a tough time with the high pressures of the machine and tightness of the mask. She is sleeping more soundly.  F/u with sleep study appt Nov 1  Obesity - she was referred to nutrition at last OV. She went to the [facility] nutritionist on [address] - three appointments. "they helped a lot".  She is down to 2 12-oz sodas/day, eating more slowly.  Plans to get gastric sleeve surgery next year once she quits smoking completely and is off the patch.   Smoking - she is on patch and gum. Is on step 2 of patch. Quit smoking 3 months ago. Stopped Jewel 3 weeks ago.  "I'm doing actually pretty good"  Goal is by Christmas "to be completely free of it".   Wt Readings from Last 3 Encounters:  09/21/18 212 lb (96.2 kg)  05/11/18 215 lb (97.5 kg)  03/31/18 222 lb 11.2 oz (101 kg)   Care gaps: PAP due in 01/2019  Review of Systems  Cardiovascular: Negative for chest pain, palpitations and leg swelling.  Psychiatric/Behavioral: Negative for self-injury and suicidal ideas. The patient is nervous/anxious.     Patient Active Problem List   Diagnosis Date Noted  . Class 3 severe obesity with body mass index (BMI) of 40.0 to 44.9 in adult [facility]) 03/23/2018  . Severe sleep apnea 03/23/2018  . Tachycardia 03/23/2018  . Episode of recurrent major depressive disorder (HCC) 03/23/2018    . Attention deficit disorder (ADD) without hyperactivity 03/23/2018  . Presence of of 52 mg levonorgestrel-releasing intrauterine device (IUD) 02/22/2016    Current Outpatient Medications on File Prior to Visit  Medication Sig Dispense Refill  . levonorgestrel (MIRENA) 20 MCG/24HR IUD 1 each by Intrauterine route once.    . venlafaxine XR (EFFEXOR-XR) 150 MG 24 hr capsule Take 1 capsule (150 mg total) by mouth daily. 30 capsule 3  . venlafaxine XR (EFFEXOR-XR) 150 MG 24 hr capsule TAKE 1 CAPSULE BY MOUTH EVERY DAY 30 capsule 3  . amphetamine-dextroamphetamine (ADDERALL XR) 20 MG 24 hr capsule Take 1 capsule (20 mg total) by mouth every morning. 30 capsule 0   No current facility-administered medications on file prior to visit.     No Known Allergies   Objective:  BP 110/80 (BP Location: Left Arm, Patient Position: Sitting, Cuff Size: Large)   Pulse (!) 101   Temp 99.2 F (37.3 C) (Oral)   Resp 16   Ht 5\' 2"  (1.575 m)   Wt 212 lb (96.2 kg)   SpO2 97%   BMI 38.78 kg/m   Physical Exam  Constitutional: She is oriented to person, place, and time. No distress.  Cardiovascular: Normal rate, regular rhythm and normal heart sounds.  Neurological: She is alert and oriented to person, place, and time.  Skin: Skin is warm and dry.  Psychiatric: Judgment normal.  Vitals reviewed.   Assessment and Plan :  1. Episode of recurrent major depressive disorder, unspecified depression episode severity (HCC) - Controlled. Denies SI or HI. Con't current dose. RTC in 6 months for annual exam and PAP.  - venlafaxine XR (EFFEXOR-XR) 150 MG 24 hr capsule; Take 1 capsule (150 mg total) by mouth daily.  Dispense: 30 capsule; Refill: 3  2. Attention deficit disorder (ADD) without hyperactivity - Doing well. Okay to fill this dose.  - amphetamine-dextroamphetamine (ADDERALL XR) 20 MG 24 hr capsule; Take 1 capsule (20 mg total) by mouth every morning.  Dispense: 30 capsule; Refill: 0  3. Severe sleep  apnea - Now on BiPAP due to con't low nighttime O2 sats. F/u with sleep study next month  4. Class 3 severe obesity with body mass index (BMI) of 40.0 to 44.9 in adult, unspecified obesity type, unspecified whether serious comorbidity present [facility]) - She is doing great, making small lifestyle changes. Weight is down 10lbs from six months ago. Plans to have gastric sleeve surgery next year once she is off nicotine patch and gum.    Marco Collie, PA-C  Primary Care at [facility] 09/21/2018 10:48 AM  Please note: Portions of this report may have been transcribed using dragon voice recognition software. Every effort was made to ensure accuracy; however, inadvertent computerized transcription errors may be present.

## 2018-09-21 NOTE — Patient Instructions (Addendum)
     IF you received an x-ray today, you will receive an invoice from [facility]. Please contact [facility] at [phone] with questions or concerns regarding your invoice.   IF you received labwork today, you will receive an invoice from [facility]. Please contact [facility] at [phone] with questions or concerns regarding your invoice.   Our billing staff will not be able to assist you with questions regarding bills from these companies.  You will be contacted with the lab results as soon as they are available. The fastest way to get your results is to activate your My Chart account. Instructions are located on the last page of this paperwork. If you have not heard from us regarding the results in 2 weeks, please contact this office.     

## 2018-10-12 ENCOUNTER — Encounter: Payer: Self-pay | Admitting: Neurology

## 2018-10-14 ENCOUNTER — Encounter: Payer: Self-pay | Admitting: Neurology

## 2018-10-14 ENCOUNTER — Telehealth: Payer: Self-pay

## 2018-10-14 ENCOUNTER — Ambulatory Visit: Payer: BLUE CROSS/BLUE SHIELD | Admitting: Neurology

## 2018-10-14 VITALS — BP 153/104 | HR 104 | Ht 61.0 in | Wt 214.0 lb

## 2018-10-14 DIAGNOSIS — G4733 Obstructive sleep apnea (adult) (pediatric): Secondary | ICD-10-CM | POA: Diagnosis not present

## 2018-10-14 DIAGNOSIS — Z789 Other specified health status: Secondary | ICD-10-CM

## 2018-10-14 NOTE — Progress Notes (Signed)
Order for bipap pressure reduction sent to [facility] via community message in EPIC. Confirmation received that the order transmitted was successful.

## 2018-10-14 NOTE — Progress Notes (Signed)
Subjective:    Patient ID: Debbie White is a 37 y.o. female.  HPI     Interim history:   Ms. Keady is a 36 year old right-handed woman with an underlying medical history of depression, ADD, smoking and obesity, who presents for follow-up consultation of her obstructive sleep apnea, after her recent titration study. The patient is unaccompanied today. I last saw her on 05/11/2018, at which time she was compliant with AutoPap but she had residual sleep disordered breathing and subsequently also an abnormal overnight pulse oximetry test after which I asked her to return for a full night titration study. She had this on 06/01/2018. I went over her test results with her in detail today. Sleep efficiency was markedly reduced at 56.1%, sleep latency delayed at 174 minutes and REM sleep was absent for the study. She was started on CPAP and titrated to a pressure of 15 cm but because of higher pressure requirements she was switched to BiPAP from 17/13 cm to 22/18 cm. On the pressure of 21/17 her AHI was 0 per hour with supine non-REM sleep achieved an O2 nadir of 89%.   Today, 10/14/2018: I reviewed her BiPAP compliance data from 09/13/2018 through 10/12/2018 which is a total of 30 days, during which time she used her machine every night with percent used days greater than 4 hours at 100%, indicating superb compliance with an average usage of 9 hours and 20 minutes, residual AHI at goal at 3.6 per hour, leak on the higher end with the 95th percentile at 21.8 L/m on a pressure of 21/17 cm. She reports that the pressure feels quite high, she is having difficulty tolerating the pressure as it is so high. She is using a fullface mask but has to tighten it, so it does not leak but this causes her to have soreness in the back of her head. She is motivated to continue with treatment. She is also working on weight loss and has looked into bariatric surgery.  The patient's allergies, current medications, family  history, past medical history, past social history, past surgical history and problem list were reviewed and updated as appropriate.    Previously (copied from previous notes for reference):    Conflict testing. The patient is unaccompanied today. I first met her on 01/27/2018 at the request of her primary care PA, at which time she reported snoring, daytime somnolence as well as witnessed apneas. She was advised to proceed with a sleep study. Her insurance denied a lab attended sleep study. She had a home sleep test on 02/17/2018 which indicated severe sleep apnea with an AHI of 76 per hour, average oxygen saturation of 87%, nadir of 70% or below. Despite the severity of her sleep apnea, she was not approved for a lab attended sleep study and was advised to proceed with AutoPap therapy at home.   I reviewed her AutoPap compliance data from 04/10/2018 through 05/09/2014 which is a total of 30 days, during which time she used her AutoPap every night with percent used days greater than 4 hours at 9 hours and 28 minutes, residual AHI suboptimal 9 per hour, mostly secondary to obstructive events, 95th percentile pressure at 15.8 cm, leak acceptable with the 95th percentile at 11.9 L/m on a pressure range of 7 cm to 16 cm with EPR.    01/27/2018: (She) reports snoring and excessive daytime somnolence as well as witnessed breathing pauses while asleep. She has had enuresis. I reviewed your office note from 12/23/2017. Her  Epworth sleepiness score is 17 out of 24, fatigue score is 48 out of 63. She lives with her boyfriend and her daughter. She works at a [employer], technical sales. She smokes about 2 cigarettes daily, drinks alcohol occasionally, drinks caffeine in the form of soda, 3-4 cans per day on average. She drinks coffee in the morning, typically 1 cup. Rise time is around 6 AM and she has to take her daughter to school, etc. one hour drive, in the afternoon it gets to be very difficult for her to stay awake while  driving. She works part-time, typically from 9:30 AM to 1:30 PM on Mondays, Wednesdays, and Fridays, bedtime is around 10:30 or 11. She has nocturia about twice per average night, has had the occasional morning headache, and in the past 2 months or so she has had worsening snoring and witnessed apneas as well as enuresis a few times. She has gained weight in the past 2-3 years. She has been on Adderall long-acting for about 10 years and Effexor long-acting for about 10 years. She has with time reduced her Adderall dose. She has no obvious family history of obstructive sleep apnea but does not know much about her father's medical history. She has had occasional restless leg symptoms, more so during her pregnancies. She has a 60 year old daughter and a 19 year old daughter. She has had occasional sleep talking, yelling out in her sleep and movements. She does not recall her dreams typically.  Her Past Medical History Is Significant For: Past Medical History:  Diagnosis Date  . Depression     Her Past Surgical History Is Significant For: No past surgical history on file.  Her Family History Is Significant For: Family History  Problem Relation Age of Onset  . Diabetes Other   . Hypertension Other   . Stroke Maternal Grandfather     Her Social History Is Significant For: Social History   Socioeconomic History  . Marital status: Single    Spouse name: Not on file  . Number of children: Not on file  . Years of education: Not on file  . Highest education level: Not on file  Occupational History  . Not on file  Social Needs  . Financial resource strain: Not on file  . Food insecurity:    Worry: Never true    Inability: Never true  . Transportation needs:    Medical: Not on file    Non-medical: Not on file  Tobacco Use  . Smoking status: Current Every Day Smoker  . Smokeless tobacco: Never Used  Substance and Sexual Activity  . Alcohol use: Yes    Comment: occas  . Drug use: No  .  Sexual activity: Not on file  Lifestyle  . Physical activity:    Days per week: Not on file    Minutes per session: Not on file  . Stress: Not on file  Relationships  . Social connections:    Talks on phone: Not on file    Gets together: Not on file    Attends religious service: Not on file    Active member of club or organization: Not on file    Attends meetings of clubs or organizations: Not on file    Relationship status: Not on file  Other Topics Concern  . Not on file  Social History Narrative  . Not on file    Her Allergies Are:  No Known Allergies:   Her Current Medications Are:  Outpatient Encounter Medications as of 10/14/2018  Medication Sig  . [START ON 11/21/2018] amphetamine-dextroamphetamine (ADDERALL XR) 20 MG 24 hr capsule Take 1 capsule (20 mg total) by mouth every morning.  Derrill Memo ON 10/22/2018] amphetamine-dextroamphetamine (ADDERALL XR) 20 MG 24 hr capsule Take 1 capsule (20 mg total) by mouth daily.  Marland Kitchen amphetamine-dextroamphetamine (ADDERALL XR) 20 MG 24 hr capsule Take 1 capsule (20 mg total) by mouth daily.  Marland Kitchen levonorgestrel (MIRENA) 20 MCG/24HR IUD 1 each by Intrauterine route once.  . venlafaxine XR (EFFEXOR-XR) 150 MG 24 hr capsule TAKE 1 CAPSULE BY MOUTH EVERY DAY  . venlafaxine XR (EFFEXOR-XR) 150 MG 24 hr capsule Take 1 capsule (150 mg total) by mouth daily.   No facility-administered encounter medications on file as of 10/14/2018.   :  Review of Systems:  Out of a complete 14 point review of systems, all are reviewed and negative with the exception of these symptoms as listed below: Review of Systems  Neurological:       Pt presents today to discuss her bipap. Pt reports that she felt better on cpap. Pt feels that the bipap pressure is too high.    Objective:  Neurological Exam  Physical Exam Physical Examination:   Vitals:   10/14/18 0933  BP: (!) 153/104  Pulse: (!) 104    General Examination: The patient is a very pleasant 37  y.o. female in no acute distress. She appears well-developed and well-nourished and well groomed.   HEENT:Normocephalic, atraumatic, pupils are equal, round and reactive to light and accommodation. Extraocular tracking is good without limitation to gaze excursion or nystagmus noted. Normal smooth pursuit is noted. Hearing is grossly intact. Face is symmetric with normal facial animation and normal facial sensation. Speech is clear with no dysarthria noted. There is no hypophonia. There is no lip, neck/head, jaw or voice tremor. Neck is supple with FROM. Oropharynx exam reveals: mildmouth dryness, adequatedental hygiene and markedairway crowding, due to smaller airway entry. Tongue protrudes centrally and palate elevates symmetrically.  Chest:Clear to auscultation without wheezing, rhonchi or crackles noted.  Heart:S1+S2+0, regular and normal without murmurs, rubs or gallops noted.   Abdomen:Soft, non-tender and non-distended with normal bowel sounds appreciated on auscultation.  Extremities:There isnopitting edema in the distal lower extremities bilaterally. Pedal pulses are intact.  Skin: Warm and dry without trophic changes noted.  Musculoskeletal: exam reveals no obvious joint deformities, tenderness or joint swelling or erythema With the exception of finger braces on digits 3 and 4 on the left hand.   Neurologically:  Mental status: The patient is awake, alert and oriented in all 4 spheres.Herimmediate and remote memory, attention, language skills and fund of knowledge are appropriate. There is no evidence of aphasia, agnosia, apraxia or anomia. Speech is clear with normal prosody and enunciation. Thought process is linear. Mood is normaland affect is normal.  Cranial nerves II - XII are as described above under HEENT exam.  Motor exam: Normal bulk, strength and tone is noted. There is no tremor or rebound. Romberg is negative.  Fine motor skills and coordination: grossly  intact, limited hand movements on the left hand.  Cerebellar testing: No dysmetria or intention tremor. There is no truncal or gait ataxia.  Sensory exam: intact to light touchinthe upper and lower extremities.  Gait, station and balance:Shestands easily. No veering to one side is noted. No leaning to one side is noted. Posture is age-appropriate and stance is narrow based. Gait showsnormalstride length and normalpace. No problems turning are noted.    Assessmentand Plan:  In summary,Janney M Fieldsis a very pleasant 12 year oldfemalewith an underlying medical history of depression, ADD, recent smoking cessation and morbid obesity, who presents for follow up consultation of her obstructive sleep apnea (OSA), after a recent titration study in July 2019. She had originally undergone a home sleep test in March 2019 which indicated severe sleep apnea with severe desaturations and nocturnal hypoxemia noted. She was compliant on AutoPap therapy but in June she had an abnormal ONO in June 2019, while on AutoPap therapy at the time. She return for a full night titration study during which she needed fairly high pressures and was started on BiPAP after that. She is compliant with treatment but is struggling with the mask and the high pressures. Today, I suggested that we reduce or BiPAP pressure 19/15 and also have her stop by the sleep lab to troubleshoot mask issues. She is advised to call our office in about 6 weeks for another download to see how she's doing on the lower pressure settings. She is encouraged to continue with treatment and also continue to work on weight loss. She is motivated to continue with BiPAP therapy. I suggested a 6 month follow-up, sooner if needed. I answered all her questions today and she was in agreement. I spent 30 minutes in total face-to-face time with the patient, more than 50% of which was spent in counseling and coordination of care, reviewing test results,  reviewing medication and discussing or reviewing the diagnosis of OSA, its prognosis and treatment options. Pertinent laboratory and imaging test results that were available during this visit with the patient were reviewed by me and considered in my medical decision making (see chart for details).

## 2018-10-14 NOTE — Patient Instructions (Signed)
Please call our office or email us through My Chart in about 6 weeks, so I can look at another download from your machine on the lower pressures. I will reduce your BiPAP to 19/15 from 21/17 cm.  Please stop in the sleep lab for a mask refit.  Follow up in about 6 months with me. Keep working on weight loss!

## 2018-10-14 NOTE — Telephone Encounter (Signed)
Pt was fitted with a new mask today due to neck strap causing neck discomfort from mask leaking.  Fitted pt with a [employer]ics Dreamwear FFM, small headgear and full face cushion.  Tried mask on pt with at home BiPAP pressure. Leak registered at 0. Showed pt how to adjust mask and straps. Pt stated she liked this mask so much better. Asked pt to give me a call next week with feedback on new mask.

## 2018-10-15 ENCOUNTER — Other Ambulatory Visit: Payer: Self-pay | Admitting: Physician Assistant

## 2018-10-15 DIAGNOSIS — F339 Major depressive disorder, recurrent, unspecified: Secondary | ICD-10-CM

## 2018-10-15 NOTE — Telephone Encounter (Signed)
Requested Prescriptions  Pending Prescriptions Disp Refills  . venlafaxine XR (EFFEXOR-XR) 150 MG 24 hr capsule [Pharmacy Med Name: VENLAFAXINE HCL ER 150 MG CAP] 90 capsule 1    Sig: TAKE 1 CAPSULE BY MOUTH EVERY DAY     Psychiatry: Antidepressants - SNRI - desvenlafaxine & venlafaxine Failed - 10/15/2018  9:37 AM      Failed - LDL in normal range and within 360 days    LDL Calculated  Date Value Ref Range Status  10/28/2017 132 (H) 0 - 99 mg/dL Final         Failed - Last BP in normal range    BP Readings from Last 1 Encounters:  10/14/18 (!) 153/104         Passed - Total Cholesterol in normal range and within 360 days    Cholesterol, Total  Date Value Ref Range Status  10/28/2017 193 100 - 199 mg/dL Final         Passed - Triglycerides in normal range and within 360 days    Triglycerides  Date Value Ref Range Status  10/28/2017 140 0 - 149 mg/dL Final         Passed - Completed PHQ-2 or PHQ-9 in the last 360 days.      Passed - Valid encounter within last 6 months    Recent Outpatient Visits          3 weeks ago Severe sleep apnea   [facility], Madelaine BhatElizabeth Whitney, PA-C   6 months ago Episode of recurrent major depressive disorder, unspecified depression episode severity [facility](HCC)   [facility] Madelaine BhatElizabeth Whitney, PA-C   9 months ago Encounter for medication management   [facility] [place], PA-C   11 months ago Headache associated with sexual activity   [facility], Madelaine BhatElizabeth Whitney, PA-C      Future Appointments            In 4 months Leretha PolSantiago, Meda CoffeeIrma M, MD [facility] [place]

## 2018-11-18 ENCOUNTER — Ambulatory Visit: Payer: BLUE CROSS/BLUE SHIELD | Admitting: Nurse Practitioner

## 2018-11-18 DIAGNOSIS — G4733 Obstructive sleep apnea (adult) (pediatric): Secondary | ICD-10-CM | POA: Diagnosis not present

## 2018-12-19 DIAGNOSIS — G4733 Obstructive sleep apnea (adult) (pediatric): Secondary | ICD-10-CM | POA: Diagnosis not present

## 2018-12-30 ENCOUNTER — Other Ambulatory Visit: Payer: Self-pay | Admitting: Physician Assistant

## 2018-12-30 DIAGNOSIS — F988 Other specified behavioral and emotional disorders with onset usually occurring in childhood and adolescence: Secondary | ICD-10-CM

## 2018-12-31 ENCOUNTER — Other Ambulatory Visit: Payer: Self-pay | Admitting: Physician Assistant

## 2018-12-31 DIAGNOSIS — F988 Other specified behavioral and emotional disorders with onset usually occurring in childhood and adolescence: Secondary | ICD-10-CM

## 2018-12-31 MED ORDER — AMPHETAMINE-DEXTROAMPHET ER 20 MG PO CP24: 20.0000 mg | ORAL_CAPSULE | Freq: Every day | ORAL | 0 refills | Status: DC

## 2018-12-31 NOTE — Telephone Encounter (Signed)
Please advise  Patient is requesting a refill of the following medications: Requested Prescriptions   Pending Prescriptions Disp Refills  . amphetamine-dextroamphetamine (ADDERALL XR) 20 MG 24 hr capsule 30 capsule 0    Sig: Take 1 capsule (20 mg total) by mouth daily for 30 days.

## 2018-12-31 NOTE — Telephone Encounter (Signed)
Chart reviewed pmp reviewed Med refilled

## 2019-01-02 ENCOUNTER — Encounter (HOSPITAL_COMMUNITY): Payer: Self-pay | Admitting: Emergency Medicine

## 2019-01-02 ENCOUNTER — Emergency Department (HOSPITAL_COMMUNITY)
Admission: EM | Admit: 2019-01-02 | Discharge: 2019-01-03 | Disposition: A | Discharge status: Home / Self Care | Payer: BLUE CROSS/BLUE SHIELD | Attending: Emergency Medicine | Admitting: Emergency Medicine

## 2019-01-02 DIAGNOSIS — F17210 Nicotine dependence, cigarettes, uncomplicated: Secondary | ICD-10-CM | POA: Insufficient documentation

## 2019-01-02 DIAGNOSIS — Z79899 Other long term (current) drug therapy: Secondary | ICD-10-CM | POA: Insufficient documentation

## 2019-01-02 DIAGNOSIS — K529 Noninfective gastroenteritis and colitis, unspecified: Secondary | ICD-10-CM | POA: Insufficient documentation

## 2019-01-02 DIAGNOSIS — R112 Nausea with vomiting, unspecified: Secondary | ICD-10-CM | POA: Diagnosis not present

## 2019-01-02 LAB — COMPREHENSIVE METABOLIC PANEL
ALT: 27 U/L (ref 0–44)
AST: 23 U/L (ref 15–41)
Albumin: 4.2 g/dL (ref 3.5–5.0)
Alkaline Phosphatase: 82 U/L (ref 38–126)
Anion gap: 15 (ref 5–15)
BILIRUBIN TOTAL: 0.4 mg/dL (ref 0.3–1.2)
BUN: 8 mg/dL (ref 6–20)
CO2: 19 mmol/L — ABNORMAL LOW (ref 22–32)
Calcium: 9.1 mg/dL (ref 8.9–10.3)
Chloride: 100 mmol/L (ref 98–111)
Creatinine, Ser: 0.79 mg/dL (ref 0.44–1.00)
GFR calc Af Amer: >60 mL/min (ref >60)
GFR calc non Af Amer: >60 mL/min (ref >60)
Glucose, Bld: 164 mg/dL — ABNORMAL HIGH (ref 70–99)
Potassium: 3.7 mmol/L (ref 3.5–5.1)
Sodium: 134 mmol/L — ABNORMAL LOW (ref 135–145)
TOTAL PROTEIN: 8.2 g/dL — AB (ref 6.5–8.1)

## 2019-01-02 LAB — CBC
HCT: 45.8 % (ref 36.0–46.0)
Hemoglobin: 14.8 g/dL (ref 12.0–15.0)
MCH: 28.7 pg (ref 26.0–34.0)
MCHC: 32.3 g/dL (ref 30.0–36.0)
MCV: 88.8 fL (ref 80.0–100.0)
Platelets: 410 10*3/uL — ABNORMAL HIGH (ref 150–400)
RBC: 5.16 MIL/uL — ABNORMAL HIGH (ref 3.87–5.11)
RDW: 12.2 % (ref 11.5–15.5)
WBC: 19.5 10*3/uL — ABNORMAL HIGH (ref 4.0–10.5)
nRBC: 0 % (ref 0.0–0.2)

## 2019-01-02 LAB — I-STAT BETA HCG BLOOD, ED (MC, WL, AP ONLY)

## 2019-01-02 LAB — LIPASE, BLOOD: Lipase: 26 U/L (ref 11–51)

## 2019-01-02 MED ORDER — SODIUM CHLORIDE 0.9% FLUSH: 3.0000 mL | Freq: Once | INTRAVENOUS | Status: DC

## 2019-01-02 MED ORDER — ONDANSETRON HCL 4 MG/2ML IJ SOLN: 4.0000 mg | Freq: Once | INTRAMUSCULAR | Status: DC

## 2019-01-02 MED ORDER — ONDANSETRON HCL 4 MG/2ML IJ SOLN
4.0000 mg | Freq: Once | INTRAMUSCULAR | Status: AC
  Administered 2019-01-02: 4 mg via INTRAVENOUS
  Filled 2019-01-02: qty 2

## 2019-01-02 MED ORDER — HALOPERIDOL LACTATE 5 MG/ML IJ SOLN: 2.0000 mg | Freq: Once | INTRAMUSCULAR | Status: DC

## 2019-01-02 MED ORDER — FENTANYL CITRATE (PF) 100 MCG/2ML IJ SOLN: 50.0000 ug | INTRAMUSCULAR | Status: DC | PRN

## 2019-01-02 MED ORDER — SODIUM CHLORIDE 0.9 % IV BOLUS
1000.0000 mL | Freq: Once | INTRAVENOUS | Status: AC
  Administered 2019-01-02: 1000 mL via INTRAVENOUS

## 2019-01-02 NOTE — ED Notes (Addendum)
Nurse starting IV and drawing labs. 

## 2019-01-02 NOTE — ED Notes (Signed)
PT C/O SUDDEN ONSET OF N V AND DIARRHEA FOR 2 HOURS AFTYER EATING SNACK FOODS VERY GREASY  ABD CRAMPS

## 2019-01-02 NOTE — ED Provider Notes (Signed)
[facility] Provider Note   CSN: [id number] Arrival date & time: 01/02/19  2253     History   Chief Complaint Chief Complaint  Patient presents with  . Emesis    HPI Debbie White is a 38 y.o. female.  38 year old female presents to the emergency department for evaluation of nausea and vomiting.  Symptoms began at 2000 tonight and have been persistent despite home use of Pepto-Bismol.  She has tried drinking ginger ale and eating crackers, but vomits after any oral intake.  She has started to notice bright red blood streaked in her emesis.  Her diarrhea began 2 hours ago.  She has had some stool incontinence with forceful vomiting.  No melena or hematochezia.  Reports general periumbilical abdominal cramping which is nonradiating.  She has no history of abdominal surgeries.  Denies any sick contacts or associated fevers, dysuria, hematuria.  She has an IUD in place and, therefore, does not have a monthly menstrual.  The history is provided by the patient. No language interpreter was used.  Emesis    Past Medical History:  Diagnosis Date  . Depression     Patient Active Problem List   Diagnosis Date Noted  . Class 3 severe obesity with body mass index (BMI) of 40.0 to 44.9 in adult [facility](HCC) 03/23/2018  . Severe sleep apnea 03/23/2018  . Tachycardia 03/23/2018  . Episode of recurrent major depressive disorder (HCC) 03/23/2018  . Attention deficit disorder (ADD) without hyperactivity 03/23/2018  . Presence of of 52 mg levonorgestrel-releasing intrauterine device (IUD) 02/22/2016    History reviewed. No pertinent surgical history.   OB History   No obstetric history on file.      Home Medications    Prior to Admission medications   Medication Sig Start Date End Date Taking? Authorizing Provider  amphetamine-dextroamphetamine (ADDERALL XR) 20 MG 24 hr capsule Take 1 capsule (20 mg total) by mouth every morning. 11/21/18 12/21/18   McVey, Madelaine BhatElizabeth Whitney, PA-C  amphetamine-dextroamphetamine (ADDERALL XR) 20 MG 24 hr capsule Take 1 capsule (20 mg total) by mouth daily. 10/22/18 11/21/18  McVey, Madelaine BhatElizabeth Whitney, PA-C  amphetamine-dextroamphetamine (ADDERALL XR) 20 MG 24 hr capsule Take 1 capsule (20 mg total) by mouth daily for 30 days. 12/31/18 01/30/19  Myles LippsSantiago, Irma M, MD  levonorgestrel (MIRENA) 20 MCG/24HR IUD 1 each by Intrauterine route once.    [provider]  ondansetron (ZOFRAN ODT) 4 MG disintegrating tablet Take 1 tablet (4 mg total) by mouth every 6 (six) hours as needed for nausea or vomiting. 01/03/19   Antony MaduraHumes, Baer Hinton, PA-C  venlafaxine XR (EFFEXOR-XR) 150 MG 24 hr capsule Take 1 capsule (150 mg total) by mouth daily. 09/21/18   McVey, Madelaine BhatElizabeth Whitney, PA-C  venlafaxine XR (EFFEXOR-XR) 150 MG 24 hr capsule TAKE 1 CAPSULE BY MOUTH EVERY DAY 10/15/18   McVey, Madelaine BhatElizabeth Whitney, PA-C    Family History Family History  Problem Relation Age of Onset  . Diabetes Other   . Hypertension Other   . Stroke Maternal Grandfather     Social History Social History   Tobacco Use  . Smoking status: Current Every Day Smoker    Types: Cigarettes  . Smokeless tobacco: Never Used  Substance Use Topics  . Alcohol use: Yes    Comment: Socially  . Drug use: No     Allergies   Patient has no known allergies.   Review of Systems Review of Systems  Gastrointestinal: Positive for vomiting.  Ten systems  reviewed and are negative for acute change, except as noted in the HPI.    Physical Exam Updated Vital Signs BP 117/72   Pulse 85   Temp 97.7 F (36.5 C) (Oral)   Resp 20   Ht 5\' 1"  (1.549 m)   Wt 95.3 kg   SpO2 95%   BMI 39.68 kg/m   Physical Exam Vitals signs and nursing note reviewed.  Constitutional:      General: She is not in acute distress.    Appearance: She is well-developed. She is not diaphoretic.     Comments: Obese female.  HENT:     Head: Normocephalic and atraumatic.  Eyes:       General: No scleral icterus.    Conjunctiva/sclera: Conjunctivae normal.  Neck:     Musculoskeletal: Normal range of motion.  Cardiovascular:     Rate and Rhythm: Regular rhythm. Tachycardia present.     Pulses: Normal pulses.     Comments: Mild tachycardia Pulmonary:     Effort: Pulmonary effort is normal. No respiratory distress.     Breath sounds: No stridor. No wheezing.     Comments: Respirations even and unlabored Abdominal:     Comments: Soft, generally tender abdomen without focality. No peritoneal signs.  Musculoskeletal: Normal range of motion.  Skin:    General: Skin is warm and dry.     Coloration: Skin is not pale.     Findings: No erythema or rash.  Neurological:     Mental Status: She is alert and oriented to person, place, and time.     Coordination: Coordination normal.  Psychiatric:        Behavior: Behavior normal.      ED Treatments / Results  Labs (all labs ordered are listed, but only abnormal results are displayed) Labs Reviewed  COMPREHENSIVE METABOLIC PANEL - Abnormal; Notable for the following components:      Result Value   Sodium 134 (*)    CO2 19 (*)    Glucose, Bld 164 (*)    Total Protein 8.2 (*)    All other components within normal limits  CBC - Abnormal; Notable for the following components:   WBC 19.5 (*)    RBC 5.16 (*)    Platelets 410 (*)    All other components within normal limits  URINALYSIS, ROUTINE W REFLEX MICROSCOPIC - Abnormal; Notable for the following components:   APPearance HAZY (*)    Ketones, ur 5 (*)    All other components within normal limits  LIPASE, BLOOD  I-STAT BETA HCG BLOOD, ED ([facility], [facility], [facility] ONLY)    EKG None  Radiology No results found.  Procedures Procedures (including critical care time)  Medications Ordered in ED Medications  sodium chloride flush (NS) 0.9 % injection 3 mL (has no administration in time range)  sodium chloride 0.9 % bolus 1,000 mL (0 mLs Intravenous Stopped 01/03/19 0127)   ondansetron (ZOFRAN) injection 4 mg (4 mg Intravenous Given 01/02/19 2347)  sodium chloride 0.9 % bolus 1,000 mL (1,000 mLs Intravenous New Bag/Given 01/03/19 0133)  ketorolac (TORADOL) 30 MG/ML injection 30 mg (30 mg Intravenous Given 01/03/19 0133)  metoCLOPramide (REGLAN) injection 10 mg (10 mg Intravenous Given 01/03/19 0334)    1:30 AM Patient feeling improved following antiemetics.  On repeat abdominal exam, she has some mild focal tenderness in the epigastrium.  No guarding noted.  Exam is improved compared to initial presentation.  Will give Toradol for pain and additional IV fluids.  Will  trial with PO water.  3:41 AM Patient with some increased nausea following PO water. No additional vomiting. Requests dose of antiemetics prior to d/c. Abdominal exam remains stable. No focal RUQ or RLQ tenderness. Labs have been generally reassuring.   Initial Impression / Assessment and Plan / ED Course  I have reviewed the triage vital signs and the nursing notes.  Pertinent labs & imaging results that were available during my care of the patient were reviewed by me and considered in my medical decision making (see chart for details).     Patient with symptoms consistent with viral gastroenteritis.  Notes sudden onset of vomiting at 2000.  Diarrhea began approximately 2 hours later.  Vitals are stable, no fever.  Tolerating PO fluids after Zofran given on arrival.  Abdomen soft without masses or peritoneal signs.  Tachycardia present upon arrival when vomiting and dry heaving. This has improved with 2L IVF.  Laboratory evaluation reviewed without significant electrolyte derangements.  Liver and kidney function preserved.  The patient does have a leukocytosis which is suspected to be from the stress of ongoing emesis; slightly low bicarb resultant of ongoing GI losses.  Her urinalysis does not suggest infection.  Pregnancy is negative.  Doubt emergent etiology of symptoms.    Will continue with  outpatient management with Zofran.  Advised patient follow-up in the next 48 hours with her PCP.  Return precautions discussed and provided. Patient discharged in stable condition with no unaddressed concerns.   Final Clinical Impressions(s) / ED Diagnoses   Final diagnoses:  Gastroenteritis    ED Discharge Orders         Ordered    ondansetron (ZOFRAN ODT) 4 MG disintegrating tablet  Every 6 hours PRN     01/03/19 0314           Antony Madura, PA-C 01/03/19 0345    Ward, Layla Maw, DO 01/03/19 [phone]

## 2019-01-02 NOTE — ED Triage Notes (Signed)
Pt reports sudden onset of abd pain, N/V/D 3-4 hrs ago. Pt actively vomiting in triage, diaphoretic, incontinent of stool from vomiting so violently.

## 2019-01-03 LAB — URINALYSIS, ROUTINE W REFLEX MICROSCOPIC
Bilirubin Urine: NEGATIVE
Glucose, UA: NEGATIVE mg/dL
Hgb urine dipstick: NEGATIVE
KETONES UR: 5 mg/dL — AB
Leukocytes, UA: NEGATIVE
Nitrite: NEGATIVE
Protein, ur: NEGATIVE mg/dL
Specific Gravity, Urine: 1.019 (ref 1.005–1.030)
pH: 7 (ref 5.0–8.0)

## 2019-01-03 MED ORDER — METOCLOPRAMIDE HCL 5 MG/ML IJ SOLN
10.0000 mg | Freq: Once | INTRAMUSCULAR | Status: AC | PRN
  Administered 2019-01-03: 10 mg via INTRAVENOUS
  Filled 2019-01-03: qty 2

## 2019-01-03 MED ORDER — SODIUM CHLORIDE 0.9 % IV BOLUS
1000.0000 mL | Freq: Once | INTRAVENOUS | Status: AC
  Administered 2019-01-03: 1000 mL via INTRAVENOUS

## 2019-01-03 MED ORDER — ONDANSETRON 4 MG PO TBDP: 4.0000 mg | ORAL_TABLET | Freq: Four times a day (QID) | ORAL | 0 refills | Status: DC | PRN

## 2019-01-03 MED ORDER — KETOROLAC TROMETHAMINE 30 MG/ML IJ SOLN
30.0000 mg | Freq: Once | INTRAMUSCULAR | Status: AC
  Administered 2019-01-03: 30 mg via INTRAVENOUS
  Filled 2019-01-03: qty 1

## 2019-01-03 NOTE — ED Notes (Signed)
Pt is feeling better at present

## 2019-01-03 NOTE — ED Notes (Signed)
The pt thinks she can go home now

## 2019-01-03 NOTE — ED Notes (Signed)
Feels better.

## 2019-01-03 NOTE — Discharge Instructions (Signed)
Avoid fried foods, fatty foods, greasy foods, and milk products until symptoms resolve. Be sure that you drink plenty of clear liquids. We recommend the use of Zofran as prescribed for nausea/vomiting. Follow-up with your primary care doctor to ensure resolution of symptoms.

## 2019-01-19 DIAGNOSIS — G4733 Obstructive sleep apnea (adult) (pediatric): Secondary | ICD-10-CM | POA: Diagnosis not present

## 2019-02-02 ENCOUNTER — Other Ambulatory Visit: Payer: Self-pay | Admitting: Family Medicine

## 2019-02-02 DIAGNOSIS — F988 Other specified behavioral and emotional disorders with onset usually occurring in childhood and adolescence: Secondary | ICD-10-CM

## 2019-02-04 MED ORDER — AMPHETAMINE-DEXTROAMPHET ER 20 MG PO CP24: 20.0000 mg | ORAL_CAPSULE | Freq: Every day | ORAL | 0 refills | Status: DC

## 2019-02-04 NOTE — Telephone Encounter (Signed)
Please advise Patient is requesting a refill of the following medications: Requested Prescriptions   Pending Prescriptions Disp Refills  . amphetamine-dextroamphetamine (ADDERALL XR) 20 MG 24 hr capsule 30 capsule 0    Sig: Take 1 capsule (20 mg total) by mouth daily for 30 days.

## 2019-02-17 DIAGNOSIS — G4733 Obstructive sleep apnea (adult) (pediatric): Secondary | ICD-10-CM | POA: Diagnosis not present

## 2019-02-24 ENCOUNTER — Telehealth: Payer: BLUE CROSS/BLUE SHIELD | Admitting: Family Medicine

## 2019-02-24 ENCOUNTER — Other Ambulatory Visit: Payer: Self-pay

## 2019-03-20 DIAGNOSIS — G4733 Obstructive sleep apnea (adult) (pediatric): Secondary | ICD-10-CM | POA: Diagnosis not present

## 2019-03-21 ENCOUNTER — Other Ambulatory Visit: Payer: Self-pay | Admitting: Family Medicine

## 2019-03-21 DIAGNOSIS — F988 Other specified behavioral and emotional disorders with onset usually occurring in childhood and adolescence: Secondary | ICD-10-CM

## 2019-03-22 ENCOUNTER — Encounter: Payer: Self-pay | Admitting: Physician Assistant

## 2019-03-31 DIAGNOSIS — L03119 Cellulitis of unspecified part of limb: Secondary | ICD-10-CM | POA: Diagnosis not present

## 2019-04-05 ENCOUNTER — Telehealth: Payer: Self-pay | Admitting: Neurology

## 2019-04-05 NOTE — Telephone Encounter (Signed)
Due to current COVID 19 pandemic, our office is severely reducing in office visits until further notice, in order to minimize the risk to our patients and healthcare providers.   Called patient to offer virtual visit for her 5/14 appt. Patient accepted and verbalized understanding of the doxy.me process. Patient is aware that she will receive an e-mail with Dr. Teofilo Pod link as well as directions. Patient understands that she will receive a call from RN as well as front office staff.  Pt understands that although there may be some limitations with this type of visit, we will take all precautions to reduce any security or privacy concerns.  Pt understands that this will be treated like an in office visit and we will file with pt's insurance, and there may be a patient responsible charge related to this service.

## 2019-04-11 ENCOUNTER — Encounter: Payer: Self-pay | Admitting: Neurology

## 2019-04-13 ENCOUNTER — Other Ambulatory Visit: Payer: Self-pay | Admitting: Physician Assistant

## 2019-04-13 DIAGNOSIS — F339 Major depressive disorder, recurrent, unspecified: Secondary | ICD-10-CM

## 2019-04-13 NOTE — Telephone Encounter (Signed)
I called pt to discuss updating her chart. No answer, left a message asking her to call me back. 

## 2019-04-13 NOTE — Telephone Encounter (Signed)
I called pt. Pt's meds, allergies, and PMH were updated.  Pt reports that she has been struggling to find a good mask fit. Otherwise, her cpap is going well.  Pt confirmed that she received the [email or web address] link via email and understands that process.

## 2019-04-13 NOTE — Addendum Note (Signed)
Addended by: Geronimo Running A on: 04/13/2019 04:03 PM   Modules accepted: Orders

## 2019-04-14 ENCOUNTER — Telehealth (INDEPENDENT_AMBULATORY_CARE_PROVIDER_SITE_OTHER): Payer: BLUE CROSS/BLUE SHIELD | Admitting: Family Medicine

## 2019-04-14 ENCOUNTER — Ambulatory Visit (INDEPENDENT_AMBULATORY_CARE_PROVIDER_SITE_OTHER): Payer: BLUE CROSS/BLUE SHIELD | Admitting: Neurology

## 2019-04-14 ENCOUNTER — Encounter: Payer: Self-pay | Admitting: Neurology

## 2019-04-14 ENCOUNTER — Other Ambulatory Visit: Payer: Self-pay

## 2019-04-14 DIAGNOSIS — G4733 Obstructive sleep apnea (adult) (pediatric): Secondary | ICD-10-CM

## 2019-04-14 DIAGNOSIS — G2581 Restless legs syndrome: Secondary | ICD-10-CM | POA: Diagnosis not present

## 2019-04-14 DIAGNOSIS — F988 Other specified behavioral and emotional disorders with onset usually occurring in childhood and adolescence: Secondary | ICD-10-CM

## 2019-04-14 DIAGNOSIS — F339 Major depressive disorder, recurrent, unspecified: Secondary | ICD-10-CM

## 2019-04-14 MED ORDER — PRAMIPEXOLE DIHYDROCHLORIDE 0.125 MG PO TABS: ORAL_TABLET | ORAL | 5 refills | Status: DC

## 2019-04-14 MED ORDER — AMPHETAMINE-DEXTROAMPHET ER 20 MG PO CP24: 20.0000 mg | ORAL_CAPSULE | ORAL | 0 refills | Status: DC

## 2019-04-14 MED ORDER — VENLAFAXINE HCL ER 150 MG PO CP24: 150.0000 mg | ORAL_CAPSULE | Freq: Every day | ORAL | 3 refills | Status: DC

## 2019-04-14 NOTE — Progress Notes (Signed)
Order for bipap pressure reduction sent to [facility] via community message. Confirmation received that the order transmitted was successful.

## 2019-04-14 NOTE — Progress Notes (Signed)
Medication refill

## 2019-04-14 NOTE — Patient Instructions (Signed)
Your BiPAP compliance is excellent, keep up the good work.   I will reduce your pressure to 18/14 cm.  For your restless legs as discussed, we will start Mirapex (generic name: pramipexole) 0.125 mg: Take 1 pill each night for 1 week, the 2 pills each night Thereafter, we do have room to increase if needed down the road. Common side effects reported are: Sedation, sleepiness, nausea, vomiting, and rare side effects are confusion, hallucinations, swelling in legs, and abnormal behaviors, including impulse control problems, which can manifest as excessive eating, obsessions with food or gambling, or hypersexuality.

## 2019-04-14 NOTE — Progress Notes (Signed)
Interim history:  Debbie White is a 38 year old right-handed woman with an underlying medical history of depression, ADD, smoking and obesity, who presents for follow-up consultation of her obstructive sleep apnea, on BiPAP therapy.  The patient is unaccompanied today and joins via cell phone from work, I am located in my office.  I last saw her on 10/14/2018, at which time she was compliant with her BiPAP, but she was having difficulty tolerating the pressure at that level.  We mutually agreed to try to reduce the pressure.  She was motivated to work on weight loss and had looked into bariatric options.   Today, 04/14/2019: I reviewed her BiPAP compliance data from 03/13/2019 through 04/11/2019 which is a total of 30 days, during which time she used her machine every night with percent used days greater than 4 hours at 97%, indicating excellent compliance with an average usage of 9 hours and 43 minutes, residual AHI at goal at 3.5/h, leak acceptable with a 95th percentile at 14.9 L/min and a pressure of 19/15.     The patient's allergies, current medications, family history, past medical history, past social history, past surgical history and problem list were reviewed and updated as appropriate.    Previously (copied from previous notes for reference):    I saw her on 05/11/2018, at which time she was compliant with AutoPap but she had residual sleep disordered breathing and subsequently also an abnormal overnight pulse oximetry test after which I asked her to return for a full night titration study. She had this on 06/01/2018. I went over her test results with her in detail today. Sleep efficiency was markedly reduced at 56.1%, sleep latency delayed at 174 minutes and REM sleep was absent for the study. She was started on CPAP and titrated to a pressure of 15 cm but because of higher pressure requirements she was switched to BiPAP from 17/13 cm to 22/18 cm. On the pressure of 21/17 her AHI was 0 per  hour with supine non-REM sleep achieved an O2 nadir of 89%.    I reviewed her BiPAP compliance data from 09/13/2018 through 10/12/2018 which is a total of 30 days, during which time she used her machine every night with percent used days greater than 4 hours at 100%, indicating superb compliance with an average usage of 9 hours and 20 minutes, residual AHI at goal at 3.6 per hour, leak on the higher end with the 95th percentile at 21.8 L/m on a pressure of 21/17 cm.    I first met her on 01/27/2018 at the request of her primary care PA, at which time she reported snoring, daytime somnolence as well as witnessed apneas. She was advised to proceed with a sleep study. Her insurance denied a lab attended sleep study. She had a home sleep test on 02/17/2018 which indicated severe sleep apnea with an AHI of 76 per hour, average oxygen saturation of 87%, nadir of 70% or below. Despite the severity of her sleep apnea, she was not approved for a lab attended sleep study and was advised to proceed with AutoPap therapy at home.   I reviewed her AutoPap compliance data from 04/10/2018 through 05/09/2014 which is a total of 30 days, during which time she used her AutoPap every night with percent used days greater than 4 hours at 9 hours and 28 minutes, residual AHI suboptimal 9 per hour, mostly secondary to obstructive events, 95th percentile pressure at 15.8 cm, leak acceptable with the 95th percentile at  11.9 L/m on a pressure range of 7 cm to 16 cm with EPR.    01/27/2018: (She) reports snoring and excessive daytime somnolence as well as witnessed breathing pauses while asleep. She has had enuresis. I reviewed your office note from 12/23/2017. Her Epworth sleepiness score is 17 out of 24, fatigue score is 48 out of 63. She lives with her boyfriend and her daughter. She works at a [employer], technical sales. She smokes about 2 cigarettes daily, drinks alcohol occasionally, drinks caffeine in the form of soda, 3-4 cans per day on  average. She drinks coffee in the morning, typically 1 cup. Rise time is around 6 AM and she has to take her daughter to school, etc. one hour drive, in the afternoon it gets to be very difficult for her to stay awake while driving. She works part-time, typically from 9:30 AM to 1:30 PM on Mondays, Wednesdays, and Fridays, bedtime is around 10:30 or 11. She has nocturia about twice per average night, has had the occasional morning headache, and in the past 2 months or so she has had worsening snoring and witnessed apneas as well as enuresis a few times. She has gained weight in the past 2-3 years. She has been on Adderall long-acting for about 10 years and Effexor long-acting for about 10 years. She has with time reduced her Adderall dose. She has no obvious family history of obstructive sleep apnea but does not know much about her fathers medical history. She has had occasional restless leg symptoms, more so during her pregnancies. She has a 109 year old daughter and a 53 year old daughter. She has had occasional sleep talking, yelling out in her sleep and movements. She does not recall her dreams typically.  Her Past Medical History Is Significant For: Past Medical History:  Diagnosis Date   Depression     Her Past Surgical History Is Significant For: No past surgical history on file.  Her Family History Is Significant For: Family History  Problem Relation Age of Onset   Diabetes Other    Hypertension Other    Stroke Maternal Grandfather     Her Social History Is Significant For: Social History   Socioeconomic History   Marital status: Single    Spouse name: Not on file   Number of children: Not on file   Years of education: Not on file   Highest education level: Not on file  Occupational History   Not on file  Social Needs   Financial resource strain: Not on file   Food insecurity:    Worry: Never true    Inability: Never true   Transportation needs:    Medical:  Not on file    Non-medical: Not on file  Tobacco Use   Smoking status: Current Every Day Smoker    Types: Cigarettes   Smokeless tobacco: Never Used  Substance and Sexual Activity   Alcohol use: Yes    Comment: Socially   Drug use: No   Sexual activity: Not on file  Lifestyle   Physical activity:    Days per week: Not on file    Minutes per session: Not on file   Stress: Not on file  Relationships   Social connections:    Talks on phone: Not on file    Gets together: Not on file    Attends religious service: Not on file    Active member of club or organization: Not on file    Attends meetings of clubs or organizations: Not on file  Relationship status: Not on file  Other Topics Concern   Not on file  Social History Narrative   Not on file    Her Allergies Are:  No Known Allergies:   Her Current Medications Are:  Outpatient Encounter Medications as of 04/14/2019  Medication Sig   amphetamine-dextroamphetamine (ADDERALL XR) 20 MG 24 hr capsule Take 1 capsule (20 mg total) by mouth daily.   amphetamine-dextroamphetamine (ADDERALL XR) 20 MG 24 hr capsule Take 1 capsule (20 mg total) by mouth daily for 30 days.   amphetamine-dextroamphetamine (ADDERALL XR) 20 MG 24 hr capsule Take 1 capsule (20 mg total) by mouth every morning for 30 days.   levonorgestrel (MIRENA) 20 MCG/24HR IUD 1 each by Intrauterine route once.   venlafaxine XR (EFFEXOR-XR) 150 MG 24 hr capsule Take 1 capsule (150 mg total) by mouth daily.   venlafaxine XR (EFFEXOR-XR) 150 MG 24 hr capsule TAKE 1 CAPSULE BY MOUTH EVERY DAY (Patient not taking: Reported on 04/14/2019)   No facility-administered encounter medications on file as of 04/14/2019.   :  Review of Systems:  Out of a complete 14 point review of systems, all are reviewed and negative with the exception of these symptoms as listed below:  Virtual Visit via Video Note on 04/14/2019:  I connected with Debbie White on 04/14/19 at 10:30 AM  EDT by a video enabled telemedicine application and verified that I am speaking with the correct person using two identifiers.   I discussed the limitations of evaluation and management by telemedicine and the availability of in person appointments. The patient expressed understanding and agreed to proceed.  History of Present Illness: She reports Doing well with the BiPAP, weight is about the same, she is working some from home and some in the office, works as a Geographical information systems officer.  She does have symptoms of restless legs which bother her, sometimes she has difficulty falling asleep at night because of restless feeling and having to get out of bed and move around.  She is compliant with her BiPAP and continues to benefit from it.  She does believe it has helped her sleep better.  She is motivated to continue with treatment.  She does have the occasional mouth dryness and would like to see if we can reduce the pressure, she would be open to trying it at a slightly lower pressure, it did help to go down on the pressure last time.  She is currently tolerating the 19/15 centimeters pressure fairly well.  She has been sleeping more on her back lately in the past couple of weeks which was also noticed by her boyfriend and therefore her mouth leakage or leakage of the air from the mask is less and that is noticeable on her compliance report.  She is using a fullface mask, she believes it is a Copy full facemask.     Observations/Objective:  On examination, she is pleasant, conversant, in no acute distress, good comprehension and language skills.  Speech is clear without dysarthria, voice tremor or hypophonia noted.  Extraocular movements are well preserved in all directions without gaze limitation and without nystagmus noted.  Face is symmetric with normal facial animation, hearing is grossly intact.  Shoulder height equal.  She is able to walk without difficulty, upper body coordination grossly  normal.  Assessment and Plan: In summary,Lysandra M Fieldsis a very pleasant 38 year oldfemalewith an underlying medical history of depression, ADD, RLS, smoking cessation and morbid obesity, whopresents for A virtual, video based appointment via  [email or web address] for follow up consultation of herobstructive sleep apnea (OSA), Established on BiPAP therapy. She had a titration study in July 2019. She had originally undergone a home sleep test in March 2019 which indicated severe sleep apnea with severe desaturations and nocturnal hypoxemia noted. She was compliant on AutoPap therapy but in June she had an abnormal ONO in June 2019, while on AutoPap therapy at the time. She returned for a full night titration study during which she needed fairly high pressures and was started on BiPAP after that. She is compliant with treatmentAnd was initially struggling with the mask and the higher pressures.  We were able to reduce her BiPAP pressure to 19/15 cm And I suggested today that we could try reducing it by 1 notch to 18/14 centimeters at this time.  She has been struggling with restless leg symptoms, she has symptoms at night, she has to get out of bed sometimes.  I suggested we try her on low-dose Mirapex generic , 0.125 mg each evening, about 90 minutes to 120 minutes before her bedtime, with gradual increase to 0.25 mg each evening.  She was given verbal instructions but also written instructions in her chart so she can pull up later under AVS, she has signed up for my chart. She is commended for her compliance, she is encouraged to continue to work on weight loss.  She is advised to routinely follow-up in 6 months, she can see 1 of our nurse practitioners at the time.  I answered all her questions today and she was in agreement  Follow Up Instructions:    I discussed the assessment and treatment plan with the patient. The patient was provided an opportunity to ask questions and all were answered. The patient  agreed with the plan and demonstrated an understanding of the instructions.   The patient was advised to call back or seek an in-person evaluation if the symptoms worsen or if the condition fails to improve as anticipated.  I provided 25 minutes of non-face-to-face time during this encounter.   Star Age, MD

## 2019-04-14 NOTE — Progress Notes (Signed)
Telemedicine Encounter- SOAP NOTE Established Patient  I discussed the limitations, risks, security and privacy concerns of performing an evaluation and management service by telephone and the availability of in person appointments. I also discussed with the patient that there may be a patient responsible charge related to this service. The patient expressed understanding and agreed to proceed.  This telephone encounter was conducted with the patient's verbal consent via audio telecommunications: yes Patient was instructed to have this encounter in a suitably private space; and to only have persons present to whom they give permission to participate. In addition, patient identity was confirmed by use of name plus two identifiers (DOB and address).  I spent a total of 10 min talking with the patient   Pt needs medication refills adderall and effexor  Subjective   Debbie White is a 38 y.o. female established patient. Telephone visit today for ADHD and depression   HPI  Pt states currently working from home with difficulty concentrating. Pt needs refill on adderall. Pt states depression well controlled with effexor. Pt states sleep apnea and RLS managed by neuro -appt later today.  Patient Active Problem List   Diagnosis Date Noted  . Class 3 severe obesity with body mass index (BMI) of 40.0 to 44.9 in adult [facility]) 03/23/2018  . Severe sleep apnea 03/23/2018  . Tachycardia 03/23/2018  . Episode of recurrent major depressive disorder (HCC) 03/23/2018  . Attention deficit disorder (ADD) without hyperactivity 03/23/2018  . Presence of of 52 mg levonorgestrel-releasing intrauterine device (IUD) 02/22/2016    Past Medical History:  Diagnosis Date  . Depression     Current Outpatient Medications  Medication Sig Dispense Refill  . amphetamine-dextroamphetamine (ADDERALL XR) 20 MG 24 hr capsule Take 1 capsule (20 mg total) by mouth every morning for 30 days. 30 capsule 0  .  levonorgestrel (MIRENA) 20 MCG/24HR IUD 1 each by Intrauterine route once.    . venlafaxine XR (EFFEXOR-XR) 150 MG 24 hr capsule Take 1 capsule (150 mg total) by mouth daily. 30 capsule 3  . amphetamine-dextroamphetamine (ADDERALL XR) 20 MG 24 hr capsule Take 1 capsule (20 mg total) by mouth daily. 30 capsule 0  . amphetamine-dextroamphetamine (ADDERALL XR) 20 MG 24 hr capsule Take 1 capsule (20 mg total) by mouth daily for 30 days. 30 capsule 0  . pramipexole (MIRAPEX) 0.125 MG tablet 1 pill each night x 1 week, then 2 pills each night thereafter. Take 90-120 minutes before bedtime. 60 tablet 5  . venlafaxine XR (EFFEXOR-XR) 150 MG 24 hr capsule TAKE 1 CAPSULE BY MOUTH EVERY DAY (Patient not taking: Reported on 04/14/2019) 90 capsule 1   No current facility-administered medications for this visit.     No Known Allergies  Social History   Socioeconomic History  . Marital status: Single    Spouse name: Not on file  . Number of children: Not on file  . Years of education: Not on file  . Highest education level: Not on file  Occupational History  . Not on file  Social Needs  . Financial resource strain: Not on file  . Food insecurity:    Worry: Never true    Inability: Never true  . Transportation needs:    Medical: Not on file    Non-medical: Not on file  Tobacco Use  . Smoking status: Current Every Day Smoker    Types: Cigarettes  . Smokeless tobacco: Never Used  Substance and Sexual Activity  . Alcohol use: Yes  Comment: Socially  . Drug use: No  . Sexual activity: Not on file  Lifestyle  . Physical activity:    Days per week: Not on file    Minutes per session: Not on file  . Stress: Not on file  Relationships  . Social connections:    Talks on phone: Not on file    Gets together: Not on file    Attends religious service: Not on file    Active member of club or organization: Not on file    Attends meetings of clubs or organizations: Not on file    Relationship  status: Not on file  . Intimate partner violence:    Fear of current or ex partner: Not on file    Emotionally abused: Not on file    Physically abused: Not on file    Forced sexual activity: Not on file  Other Topics Concern  . Not on file  Social History Narrative  . Not on file    ROS CONSTITUTIONAL: no weight loss Cardio-no CP NEURO-OSA WUJ:[id number]:depression and ADD Objective   Vitals as reported by the patient: none Diagnoses and all orders for this visit:  Attention deficit disorder (ADD) without hyperactivity -     amphetamine-dextroamphetamine (ADDERALL XR) 20 MG 24 hr capsule; Take 1 capsule (20 mg total) by mouth every morning for 30 days.  Episode of recurrent major depressive disorder, unspecified depression episode severity (HCC)   Risk/benefit/side effect of medication  I discussed the assessment and treatment plan with the patient. The patient was provided an opportunity to ask questions and all were answered. The patient agreed with the plan and demonstrated an understanding of the instructions.   The patient was advised to call back or seek an in-person evaluation if the symptoms worsen or if the condition fails to improve as anticipated.  I provided 10 minutes of non-face-to-face time during this encounter.  LISA Mat CarneLEIGH CORUM, MD  Primary [facility] at [facility] 04-14-19

## 2019-04-19 DIAGNOSIS — G4733 Obstructive sleep apnea (adult) (pediatric): Secondary | ICD-10-CM | POA: Diagnosis not present

## 2019-04-21 ENCOUNTER — Telehealth: Payer: Self-pay

## 2019-04-21 NOTE — Telephone Encounter (Signed)
I called pt, scheduled her 6 mo follow up with MM. Pt verbalized understanding of new appt date and time. 

## 2019-05-17 ENCOUNTER — Other Ambulatory Visit: Payer: Self-pay | Admitting: Family Medicine

## 2019-05-17 DIAGNOSIS — F988 Other specified behavioral and emotional disorders with onset usually occurring in childhood and adolescence: Secondary | ICD-10-CM

## 2019-05-17 MED ORDER — AMPHETAMINE-DEXTROAMPHET ER 20 MG PO CP24: 20.0000 mg | ORAL_CAPSULE | Freq: Every day | ORAL | 0 refills | Status: DC

## 2019-05-17 NOTE — Telephone Encounter (Signed)
Patient is requesting a refill of the following medications: Requested Prescriptions   Pending Prescriptions Disp Refills  . amphetamine-dextroamphetamine (ADDERALL XR) 20 MG 24 hr capsule 30 capsule 0    Sig: Take 1 capsule (20 mg total) by mouth daily for 30 days.    Date of patient request: 05/16/2019 Last office visit:09/21/2018 Date of last refill: 10/22/2018 Last refill amount: 30 Follow up time period per chart: n/a

## 2019-05-20 DIAGNOSIS — G4733 Obstructive sleep apnea (adult) (pediatric): Secondary | ICD-10-CM | POA: Diagnosis not present

## 2019-06-09 DIAGNOSIS — G4733 Obstructive sleep apnea (adult) (pediatric): Secondary | ICD-10-CM | POA: Diagnosis not present

## 2019-06-28 ENCOUNTER — Other Ambulatory Visit: Payer: Self-pay | Admitting: Family Medicine

## 2019-06-28 DIAGNOSIS — F988 Other specified behavioral and emotional disorders with onset usually occurring in childhood and adolescence: Secondary | ICD-10-CM

## 2019-06-30 ENCOUNTER — Other Ambulatory Visit: Payer: Self-pay | Admitting: Family Medicine

## 2019-06-30 ENCOUNTER — Encounter: Payer: Self-pay | Admitting: Family Medicine

## 2019-06-30 DIAGNOSIS — F988 Other specified behavioral and emotional disorders with onset usually occurring in childhood and adolescence: Secondary | ICD-10-CM

## 2019-06-30 MED ORDER — AMPHETAMINE-DEXTROAMPHET ER 20 MG PO CP24: 20.0000 mg | ORAL_CAPSULE | Freq: Every day | ORAL | 0 refills | Status: DC

## 2019-07-08 DIAGNOSIS — G4733 Obstructive sleep apnea (adult) (pediatric): Secondary | ICD-10-CM | POA: Diagnosis not present

## 2019-08-01 ENCOUNTER — Other Ambulatory Visit: Payer: Self-pay | Admitting: Family Medicine

## 2019-08-01 DIAGNOSIS — F988 Other specified behavioral and emotional disorders with onset usually occurring in childhood and adolescence: Secondary | ICD-10-CM

## 2019-08-03 NOTE — Telephone Encounter (Signed)
Please advise. Dgaddy, CMA 

## 2019-08-09 ENCOUNTER — Other Ambulatory Visit: Payer: Self-pay | Admitting: Family Medicine

## 2019-08-09 DIAGNOSIS — F988 Other specified behavioral and emotional disorders with onset usually occurring in childhood and adolescence: Secondary | ICD-10-CM

## 2019-08-10 MED ORDER — AMPHETAMINE-DEXTROAMPHET ER 20 MG PO CP24: 20.0000 mg | ORAL_CAPSULE | Freq: Every day | ORAL | 0 refills | Status: DC

## 2019-08-10 NOTE — Telephone Encounter (Signed)
Please advise on refills   Last telemed is 04/14/19  Future appt is not scheduled

## 2019-08-12 ENCOUNTER — Ambulatory Visit: Payer: BLUE CROSS/BLUE SHIELD | Admitting: Family Medicine

## 2019-08-12 ENCOUNTER — Other Ambulatory Visit: Payer: Self-pay

## 2019-08-12 ENCOUNTER — Encounter: Payer: Self-pay | Admitting: Family Medicine

## 2019-08-12 VITALS — BP 125/87 | HR 97 | Temp 98.8°F | Ht 61.0 in | Wt 204.0 lb

## 2019-08-12 DIAGNOSIS — F339 Major depressive disorder, recurrent, unspecified: Secondary | ICD-10-CM

## 2019-08-12 DIAGNOSIS — Z23 Encounter for immunization: Secondary | ICD-10-CM | POA: Diagnosis not present

## 2019-08-12 DIAGNOSIS — G4733 Obstructive sleep apnea (adult) (pediatric): Secondary | ICD-10-CM

## 2019-08-12 DIAGNOSIS — G2581 Restless legs syndrome: Secondary | ICD-10-CM | POA: Insufficient documentation

## 2019-08-12 DIAGNOSIS — Z79899 Other long term (current) drug therapy: Secondary | ICD-10-CM

## 2019-08-12 DIAGNOSIS — F988 Other specified behavioral and emotional disorders with onset usually occurring in childhood and adolescence: Secondary | ICD-10-CM | POA: Diagnosis not present

## 2019-08-12 MED ORDER — AMPHETAMINE-DEXTROAMPHET ER 20 MG PO CP24: 20.0000 mg | ORAL_CAPSULE | Freq: Every day | ORAL | 0 refills | Status: DC

## 2019-08-12 MED ORDER — VENLAFAXINE HCL ER 150 MG PO CP24: 150.0000 mg | ORAL_CAPSULE | Freq: Every day | ORAL | 1 refills | Status: DC

## 2019-08-12 NOTE — Progress Notes (Signed)
9/11/20209:29 AM  Debbie White 30-Dec-1980, 38 y.o., female [id number]  Chief Complaint  Patient presents with  . Transitions Of Care    needing refill on adderall    HPI:   Patient is a 38 y.o. female with past medical history significant for ADD, OSA on bipap, RLS and depression who presents today for transfer of care  Last OV May 2020 - telemedicine with Dr Judee Claraorum  Neurologist, Dr Wilhelmina McardleAthar Gyn, [place], [facility]  ADD diagnosed in adulthood, ~ 22 by PCP Has been on current regime for about 3 years Takes adderrall XR 20mg  daily - works well Mostly helps here with keeping her concentration, staying on task, complete projects at work, helps her remember things like taking her keys before she left her house Denies any palpitations, mood changes, insomnia, anorexia pmp reviewed, last rx filled July 30th, has been wo medications for about a week  Depression well controlled Denies any side effects  [facility] 08/12/2019 04/14/2019 09/21/2018  Decreased Interest 0 0 0  Down, Depressed, Hopeless 1 0 0  PHQ - 2 Score 1 0 0  Altered sleeping 1 - -  Tired, decreased energy 2 - -  Change in appetite 2 - -  Feeling bad or failure about yourself  1 - -  Trouble concentrating 2 - -  Moving slowly or fidgety/restless 0 - -  Suicidal thoughts 0 - -  PHQ-9 Score 9 - -  Difficult doing work/chores Not difficult at all - -    Fall Risk  08/12/2019 04/14/2019 09/21/2018 02/26/2018 01/28/2018  Falls in the past year? 0 0 No No No  Number falls in past yr: 0 0 - - -  Injury with Fall? 0 0 - - -  Follow up - Falls evaluation completed - - -     No Known Allergies  Prior to Admission medications   Medication Sig Start Date End Date Taking? Authorizing Provider  amphetamine-dextroamphetamine (ADDERALL XR) 20 MG 24 hr capsule Take 1 capsule (20 mg total) by mouth daily. 08/10/19 09/09/19 Yes Doristine BosworthStallings, Zoe A, MD  levonorgestrel (MIRENA) 20 MCG/24HR IUD 1 each  by Intrauterine route once.   Yes [provider]  pramipexole (MIRAPEX) 0.125 MG tablet 1 pill each night x 1 week, then 2 pills each night thereafter. Take 90-120 minutes before bedtime. 04/14/19  Yes Huston FoleyAthar, Saima, MD  venlafaxine XR (EFFEXOR-XR) 150 MG 24 hr capsule Take 1 capsule (150 mg total) by mouth daily. 04/14/19  Yes Corum, Minerva FesterLisa L, MD    Past Medical History:  Diagnosis Date  . Depression     No past surgical history on file.  Social History   Tobacco Use  . Smoking status: Current Every Day Smoker    Types: Cigarettes  . Smokeless tobacco: Never Used  Substance Use Topics  . Alcohol use: Yes    Comment: Socially    Family History  Problem Relation Age of Onset  . Diabetes Other   . Hypertension Other   . Stroke Maternal Grandfather     ROS Per hpi  OBJECTIVE:  Today's Vitals   08/12/19 0925  BP: 125/87  Pulse: 97  Temp: 98.8 F (37.1 C)  SpO2: 98%  Weight: 204 lb (92.5 kg)  Height: 5\' 1"  (1.549 m)   Body mass index is 38.55 kg/m.   Physical Exam Vitals signs and nursing note reviewed.  Constitutional:      Appearance: She is well-developed.  HENT:  Head: Normocephalic and atraumatic.     Mouth/Throat:     Pharynx: No oropharyngeal exudate.  Eyes:     General: No scleral icterus.    Conjunctiva/sclera: Conjunctivae normal.     Pupils: Pupils are equal, round, and reactive to light.  Neck:     Musculoskeletal: Neck supple.  Cardiovascular:     Rate and Rhythm: Normal rate and regular rhythm.     Heart sounds: Normal heart sounds. No murmur. No friction rub. No gallop.   Pulmonary:     Effort: Pulmonary effort is normal.     Breath sounds: Normal breath sounds. No wheezing or rales.  Lymphadenopathy:     Cervical: No cervical adenopathy.  Skin:    General: Skin is warm and dry.  Neurological:     Mental Status: She is alert and oriented to person, place, and time.     No results found for this or any previous visit (from  the past 24 hour(s)).  No results found.   ASSESSMENT and PLAN 1. Attention deficit disorder (ADD) without hyperactivity Controlled. Continue current regime. Patient to call in 3 months for 3 more months of refills. - amphetamine-dextroamphetamine (ADDERALL XR) 20 MG 24 hr capsule; Take 1 capsule (20 mg total) by mouth daily.  2. Encounter for medication management Anticipate neg UDS as patient has been wo medication for about a week. - ToxASSURE Select 13 (MW), Urine  3. Episode of recurrent major depressive disorder, unspecified depression episode severity ([place]) Controlled. Continue current regime.  - venlafaxine XR (EFFEXOR-XR) 150 MG 24 hr capsule; Take 1 capsule (150 mg total) by mouth daily.  4. Need for prophylactic vaccination and inoculation against influenza - Flu Vaccine QUAD 36+ mos IM  5. OSA treated with BiPAP 6. RLS (restless legs syndrome) Managed by neuro  Other orders - amphetamine-dextroamphetamine (ADDERALL XR) 20 MG 24 hr capsule; Take 1 capsule (20 mg total) by mouth daily. Ok to fill 60 days after signature - amphetamine-dextroamphetamine (ADDERALL XR) 20 MG 24 hr capsule; Take 1 capsule (20 mg total) by mouth daily. Ok to fill 30 days after signature  Return in about 6 months (around 02/09/2020).    Rutherford Guys, MD Primary Care at [place], [place] [address] Ph.  [phone] Fax [phone]

## 2019-08-12 NOTE — Patient Instructions (Signed)
° ° ° °  If you have lab work done today you will be contacted with your lab results within the next 2 weeks.  If you have not heard from us then please contact us. The fastest way to get your results is to register for My Chart. ° ° °IF you received an x-ray today, you will receive an invoice from [facility]. Please contact [facility] at [phone] with questions or concerns regarding your invoice.  ° °IF you received labwork today, you will receive an invoice from [facility]. Please contact [facility] at [phone] with questions or concerns regarding your invoice.  ° °Our billing staff will not be able to assist you with questions regarding bills from these companies. ° °You will be contacted with the lab results as soon as they are available. The fastest way to get your results is to activate your My Chart account. Instructions are located on the last page of this paperwork. If you have not heard from us regarding the results in 2 weeks, please contact this office. °  ° ° ° °

## 2019-08-16 LAB — TOXASSURE SELECT 13 (MW), URINE

## 2019-10-05 ENCOUNTER — Other Ambulatory Visit: Payer: Self-pay | Admitting: Neurology

## 2019-10-05 DIAGNOSIS — G2581 Restless legs syndrome: Secondary | ICD-10-CM

## 2019-10-05 NOTE — Telephone Encounter (Signed)
I called pt. She is doing well with 2 tablets QHS of mirapex. I will refill it as requested and sent to [facility]. I reminded pt of her upcoming appt. Pt verbalized understanding of recommendations.

## 2019-10-28 DIAGNOSIS — G4733 Obstructive sleep apnea (adult) (pediatric): Secondary | ICD-10-CM | POA: Diagnosis not present

## 2019-10-31 ENCOUNTER — Ambulatory Visit: Payer: Self-pay | Admitting: Adult Health

## 2019-10-31 ENCOUNTER — Encounter: Payer: Self-pay | Admitting: Adult Health

## 2019-10-31 ENCOUNTER — Telehealth: Payer: Self-pay

## 2019-10-31 NOTE — Telephone Encounter (Signed)
Patient was a no call/no show for their appointment today.   

## 2019-11-08 ENCOUNTER — Encounter: Payer: Self-pay | Admitting: Family Medicine

## 2019-11-08 ENCOUNTER — Ambulatory Visit: Payer: BC Managed Care – PPO | Admitting: Family Medicine

## 2019-11-08 ENCOUNTER — Other Ambulatory Visit: Payer: Self-pay

## 2019-11-08 VITALS — BP 120/87 | HR 111 | Temp 98.4°F | Ht 61.0 in | Wt 201.4 lb

## 2019-11-08 DIAGNOSIS — L02415 Cutaneous abscess of right lower limb: Secondary | ICD-10-CM | POA: Diagnosis not present

## 2019-11-08 DIAGNOSIS — L03119 Cellulitis of unspecified part of limb: Secondary | ICD-10-CM

## 2019-11-08 DIAGNOSIS — L02419 Cutaneous abscess of limb, unspecified: Secondary | ICD-10-CM

## 2019-11-08 MED ORDER — DOXYCYCLINE HYCLATE 100 MG PO TABS: 100.0000 mg | ORAL_TABLET | Freq: Two times a day (BID) | ORAL | 0 refills | Status: DC

## 2019-11-08 NOTE — Patient Instructions (Addendum)
° ° ° °  If you have lab work done today you will be contacted with your lab results within the next 2 weeks.  If you have not heard from us then please contact us. The fastest way to get your results is to register for My Chart. ° ° °IF you received an x-ray today, you will receive an invoice from [facility]. Please contact  Radiology at [phone] with questions or concerns regarding your invoice.  ° °IF you received labwork today, you will receive an invoice from [facility]. Please contact [facility] at [phone] with questions or concerns regarding your invoice.  ° °Our billing staff will not be able to assist you with questions regarding bills from these companies. ° °You will be contacted with the lab results as soon as they are available. The fastest way to get your results is to activate your My Chart account. Instructions are located on the last page of this paperwork. If you have not heard from us regarding the results in 2 weeks, please contact this office. °  ° ° ° °

## 2019-11-08 NOTE — Progress Notes (Signed)
12/8/20204:01 PM  Debbie White July 21, 1981, 38 y.o., female [id number]  Chief Complaint  Patient presents with  . Recurrent Skin Infections    near the groin, size of ping pong ball, causing pain. This is the 6th one this month. Others went away with neosporin    HPI:   Patient is a 38 y.o. female with past medical history significant for ADD who presents today for painful bump in groin area  Very painful red tender bump along inner right thigh/groin area Has been putting warm compresses No drainage No fever or chills   Depression screen [facility] 2/9 11/08/2019 08/12/2019 04/14/2019  Decreased Interest 0 0 0  Down, Depressed, Hopeless 0 1 0  PHQ - 2 Score 0 1 0  Altered sleeping - 1 -  Tired, decreased energy - 2 -  Change in appetite - 2 -  Feeling bad or failure about yourself  - 1 -  Trouble concentrating - 2 -  Moving slowly or fidgety/restless - 0 -  Suicidal thoughts - 0 -  PHQ-9 Score - 9 -  Difficult doing work/chores - Not difficult at all -    Fall Risk  11/08/2019 08/12/2019 04/14/2019 09/21/2018 02/26/2018  Falls in the past year? 0 0 0 No No  Number falls in past yr: 0 0 0 - -  Injury with Fall? 0 0 0 - -  Follow up - - Falls evaluation completed - -     No Known Allergies  Prior to Admission medications   Medication Sig Start Date End Date Taking? Authorizing Provider  amphetamine-dextroamphetamine (ADDERALL XR) 20 MG 24 hr capsule Take 1 capsule (20 mg total) by mouth daily. Ok to fill 60 days after signature 08/12/19  Yes Rutherford Guys, MD  amphetamine-dextroamphetamine (ADDERALL XR) 20 MG 24 hr capsule Take 1 capsule (20 mg total) by mouth daily. Ok to fill 30 days after signature 08/12/19  Yes Rutherford Guys, MD  levonorgestrel [facility]) 20 MCG/24HR IUD 1 each by Intrauterine route once.   Yes [provider]  pramipexole (MIRAPEX) 0.125 MG tablet Take 2 tablets (0.25 mg total) by mouth at bedtime. TAKE 90-120 MINUTES BEFORE BEDTIME. 10/05/19   Yes Star Age, MD  venlafaxine XR (EFFEXOR-XR) 150 MG 24 hr capsule Take 1 capsule (150 mg total) by mouth daily. 08/12/19  Yes Rutherford Guys, MD  amphetamine-dextroamphetamine (ADDERALL XR) 20 MG 24 hr capsule Take 1 capsule (20 mg total) by mouth daily. 08/12/19 09/11/19  Rutherford Guys, MD    Past Medical History:  Diagnosis Date  . Depression     No past surgical history on file.  Social History   Tobacco Use  . Smoking status: Current Every Day Smoker    Types: Cigarettes  . Smokeless tobacco: Never Used  Substance Use Topics  . Alcohol use: Yes    Comment: Socially    Family History  Problem Relation Age of Onset  . Diabetes Other   . Hypertension Other   . Stroke Maternal Grandfather   . Healthy Daughter     ROS Per hpi  OBJECTIVE:  Today's Vitals   11/08/19 1540  BP: 120/87  Pulse: (!) 111  Temp: 98.4 F (36.9 C)  SpO2: 97%  Weight: 201 lb 6.4 oz (91.4 kg)  Height: 5\' 1"  (1.549 m)   Body mass index is 38.05 kg/m.   Physical Exam   Gen: AAOx3, NAD Skin: inner upper right thigh a 5x2 cm fluctuant, tender, erythematous and warm  abscess.  I+D procedure: Discussed treatment options, including I+D with r/se/b. Verbal consent obtained. Area cleansed with betadine. 3 cc of 1% lidocaine used for topical anesthesia. Incision made with 11-blade. Copious purulent foul smelling drainage expressed. Cavity irrigated with saline until clear return. Cavity packed with 1" packing. Clean dressing applied. Patient tolerated procedure well.  No results found for this or any previous visit (from the past 24 hour(s)).  No results found.   ASSESSMENT and PLAN  1. Cellulitis and abscess of leg, except foot S/p I+D. Tolerated well. Improved pain. Discussed warm compresses. Start abx, reviewed r/se/b,  - doxycycline (VIBRA-TABS) 100 MG tablet; Take 1 tablet (100 mg total) by mouth 2 (two) times daily.  Return in about 1 day (around 11/09/2019) for packing  change.    Myles Lipps, MD Primary Care at [facility] [address] [place], [place] [address] Ph.  [phone] Fax [phone]

## 2019-11-09 ENCOUNTER — Ambulatory Visit (INDEPENDENT_AMBULATORY_CARE_PROVIDER_SITE_OTHER): Payer: BC Managed Care – PPO | Admitting: Adult Health Nurse Practitioner

## 2019-11-09 ENCOUNTER — Encounter: Payer: Self-pay | Admitting: Adult Health Nurse Practitioner

## 2019-11-09 VITALS — BP 128/88 | HR 105 | Temp 98.4°F | Ht 61.0 in | Wt 203.0 lb

## 2019-11-09 DIAGNOSIS — L02419 Cutaneous abscess of limb, unspecified: Secondary | ICD-10-CM

## 2019-11-09 DIAGNOSIS — L03119 Cellulitis of unspecified part of limb: Secondary | ICD-10-CM

## 2019-11-09 DIAGNOSIS — E038 Other specified hypothyroidism: Secondary | ICD-10-CM

## 2019-11-09 DIAGNOSIS — Z6841 Body Mass Index (BMI) 40.0 and over, adult: Secondary | ICD-10-CM | POA: Diagnosis not present

## 2019-11-09 LAB — POCT CBC
Granulocyte percent: 8.4 %G — AB (ref 37–80)
HCT, POC: 41.4 % — AB (ref 29–41)
Hemoglobin: 13.6 g/dL (ref 11–14.6)
Lymph, poc: 27.9 — AB (ref 0.6–3.4)
MCH, POC: 29.7 pg (ref 27–31.2)
MCHC: 33 g/dL (ref 31.8–35.4)
MCV: 89.9 fL (ref 76–111)
MID (cbc): 69.6 — AB (ref 0–0.9)
MPV: 8.7 fL (ref 0–99.8)
POC Granulocyte: 0.3 — AB (ref 2–6.9)
POC LYMPH PERCENT: 2.5 %L — AB (ref 10–50)
POC MID %: 3.4 %M (ref 0–12)
Platelet Count, POC: 347 10*3/uL (ref 142–424)
RBC: 4.6 M/uL (ref 4.04–5.48)
RDW, POC: 12.4 %
WBC: 12.1 10*3/uL — AB (ref 4.6–10.2)

## 2019-11-09 LAB — POCT GLYCOSYLATED HEMOGLOBIN (HGB A1C): Hemoglobin A1C: 5.8 % — AB (ref 4.0–5.6)

## 2019-11-09 MED ORDER — MUPIROCIN 2 % EX OINT: 1.0000 "application " | TOPICAL_OINTMENT | Freq: Two times a day (BID) | CUTANEOUS | 0 refills | Status: DC

## 2019-11-09 NOTE — Patient Instructions (Signed)
° ° ° °  If you have lab work done today you will be contacted with your lab results within the next 2 weeks.  If you have not heard from us then please contact us. The fastest way to get your results is to register for My Chart. ° ° °IF you received an x-ray today, you will receive an invoice from [facility]. Please contact [facility] at [phone] with questions or concerns regarding your invoice.  ° °IF you received labwork today, you will receive an invoice from [facility]. Please contact [facility] at [phone] with questions or concerns regarding your invoice.  ° °Our billing staff will not be able to assist you with questions regarding bills from these companies. ° °You will be contacted with the lab results as soon as they are available. The fastest way to get your results is to activate your My Chart account. Instructions are located on the last page of this paperwork. If you have not heard from us regarding the results in 2 weeks, please contact this office. °  ° ° ° °

## 2019-11-10 LAB — CMP14+EGFR
ALT: 16 IU/L (ref 0–32)
AST: 14 IU/L (ref 0–40)
Albumin/Globulin Ratio: 1.5 (ref 1.2–2.2)
Albumin: 4.3 g/dL (ref 3.8–4.8)
Alkaline Phosphatase: 95 IU/L (ref 39–117)
BUN/Creatinine Ratio: 11 (ref 9–23)
BUN: 7 mg/dL (ref 6–24)
Bilirubin Total: <0.2 mg/dL (ref 0.0–1.2)
CO2: 23 mmol/L (ref 20–29)
Calcium: 9.1 mg/dL (ref 8.7–10.2)
Chloride: 101 mmol/L (ref 96–106)
Creatinine, Ser: 0.63 mg/dL (ref 0.57–1.00)
GFR calc Af Amer: 130 mL/min/{1.73_m2} (ref >59)
GFR calc non Af Amer: 113 mL/min/{1.73_m2} (ref >59)
Globulin, Total: 2.8 g/dL (ref 1.5–4.5)
Glucose: 79 mg/dL (ref 65–99)
Potassium: 4.1 mmol/L (ref 3.5–5.2)
Sodium: 137 mmol/L (ref 134–144)
Total Protein: 7.1 g/dL (ref 6.0–8.5)

## 2019-11-10 LAB — TSH: TSH: 1.83 u[IU]/mL (ref 0.450–4.500)

## 2019-11-11 ENCOUNTER — Other Ambulatory Visit: Payer: Self-pay

## 2019-11-11 ENCOUNTER — Ambulatory Visit (INDEPENDENT_AMBULATORY_CARE_PROVIDER_SITE_OTHER): Payer: BC Managed Care – PPO | Admitting: Adult Health Nurse Practitioner

## 2019-11-11 VITALS — BP 122/84 | HR 115 | Temp 98.2°F | Resp 18 | Ht 61.0 in | Wt 201.2 lb

## 2019-11-11 DIAGNOSIS — L03119 Cellulitis of unspecified part of limb: Secondary | ICD-10-CM | POA: Diagnosis not present

## 2019-11-11 DIAGNOSIS — L02419 Cutaneous abscess of limb, unspecified: Secondary | ICD-10-CM

## 2019-11-11 NOTE — Progress Notes (Deleted)
Patient ID: ADAYAH AROCHO, female   DOB: 1981-11-29, 38 y.o.   MRN: [id number]  Chief Complaint  Patient presents with  . Follow up on wound care    Pt stated that it is doing much better.    HPI0  [place] MAALLE STARRETT is a 38 y.o. female.  *** HPI  Past Medical History:  Diagnosis Date  . Depression     No past surgical history on file.  Family History  Problem Relation Age of Onset  . Diabetes Other   . Hypertension Other   . Stroke Maternal Grandfather   . Healthy Daughter     Social History Social History   Tobacco Use  . Smoking status: Current Every Day Smoker    Types: Cigarettes  . Smokeless tobacco: Never Used  Substance Use Topics  . Alcohol use: Yes    Comment: Socially  . Drug use: No    No Known Allergies  Current Outpatient Medications  Medication Sig Dispense Refill  . amphetamine-dextroamphetamine (ADDERALL XR) 20 MG 24 hr capsule Take 1 capsule (20 mg total) by mouth daily. 30 capsule 0  . doxycycline (VIBRA-TABS) 100 MG tablet Take 1 tablet (100 mg total) by mouth 2 (two) times daily. 20 tablet 0  . levonorgestrel (MIRENA) 20 MCG/24HR IUD 1 each by Intrauterine route once.    . mupirocin ointment (BACTROBAN) 2 % Place 1 application into the nose 2 (two) times daily. 22 g 0  . pramipexole (MIRAPEX) 0.125 MG tablet Take 2 tablets (0.25 mg total) by mouth at bedtime. TAKE 90-120 MINUTES BEFORE BEDTIME. 180 tablet 1  . venlafaxine XR (EFFEXOR-XR) 150 MG 24 hr capsule Take 1 capsule (150 mg total) by mouth daily. 90 capsule 1   No current facility-administered medications for this visit.    Review of Systems Review of Systems  Blood pressure 122/84, pulse (!) 115, temperature 98.2 F (36.8 C), temperature source Temporal, resp. rate 18, height 5\' 1"  (1.549 m), weight 201 lb 3.2 oz (91.3 kg), SpO2 96 %.  Physical Exam Physical Exam  Data Reviewed ***  Assessment ***  Plan ***    Glyn Ade 11/11/2019, 2:39 PM

## 2019-11-11 NOTE — Progress Notes (Signed)
   Subjective:    Patient ID: Debbie White, female    DOB: 03-26-1981, 38 y.o.   MRN: [id number]  HPI   Returns for wound check, evaluation of abscess to right inner groin.  Improved a great deal with no pain, drainage.  The area has significantly decreased in size and patient continues to take antibiotic. No pain, purulent discharge, fever, chills, or night sweats.       Review of Systems Review of Systems See HPI Constitution: No fevers or chills No malaise No diaphoresis Skin: No rash or itching Eyes: no blurry vision, no double vision GU: no dysuria or hematuria Neuro: no dizziness or headaches Skin: + for wound      Objective:   Physical Exam  General appearance: alert, well appearing, and in no distress. Skin exam -abscess diminished to dime size without opening in skin, mild erythema, no warmth or purulent drainage.  Non-tender.    LABs:  Personally reviewed labs ordered at last visit which were negative for any abnormality. Patient is pleased        Assessment & Plan:   1. Cellulitis and abscess of leg, except foot     Improving.  Will continue to cover area until completely healed.  NO further f/u needed.  She is inline with this plan.   Glyn Ade, NP

## 2019-11-14 ENCOUNTER — Other Ambulatory Visit: Payer: Self-pay | Admitting: Family Medicine

## 2019-11-14 DIAGNOSIS — F988 Other specified behavioral and emotional disorders with onset usually occurring in childhood and adolescence: Secondary | ICD-10-CM

## 2019-11-14 MED ORDER — AMPHETAMINE-DEXTROAMPHET ER 20 MG PO CP24: 20.0000 mg | ORAL_CAPSULE | Freq: Every day | ORAL | 0 refills | Status: DC

## 2019-11-14 NOTE — Telephone Encounter (Signed)
pmp reviewd, appropriate meds refilled 

## 2019-11-15 ENCOUNTER — Encounter: Payer: Self-pay | Admitting: Adult Health Nurse Practitioner

## 2019-11-15 ENCOUNTER — Encounter: Payer: Self-pay | Admitting: Family Medicine

## 2019-11-15 DIAGNOSIS — L02419 Cutaneous abscess of limb, unspecified: Secondary | ICD-10-CM

## 2019-11-15 DIAGNOSIS — L03119 Cellulitis of unspecified part of limb: Secondary | ICD-10-CM | POA: Insufficient documentation

## 2019-11-15 HISTORY — DX: Cellulitis of unspecified part of limb: L02.419

## 2019-11-15 NOTE — Progress Notes (Signed)
   Subjective:    Patient ID: Debbie White, female    DOB: 12-04-1980, 38 y.o.   MRN: [id number]  HPI  Patient presents for f/u from Dr. Pamella Pert for an abscess with I&D, packed with iodiform on the inner thigh of her right lower extremity adjacent to groin.  Reports that she gets multiple skin "boils."  Unsure why.  They have needed drainage before.  She has scars from previous abscesses.  Today is following up on Doxycycline which she has been taking.  Site remains exquisitely tender.  Has not taken a shower due to the wound and keeping it clean.  No further drainage beyond bandage.  No chills, fevers, night sweats, or systemic symptoms.   We did discuss etiology of recurrent skin abscesses and reviewed last labs in chart.    Review of Systems   Review of Systems See HPI Constitution: No fevers or chills No malaise No diaphoresis Skin: No rash or itching Eyes: no blurry vision, no double vision GU: no dysuria or hematuria Neuro: no dizziness or headaches      Objective:   Physical Exam   General appearance: alert, well appearing, and in no distress, in mild to moderate distress and crying. Skin exam - LESIONS NOTED: normal complete skin exam, no suspicious lesions, skin abscess on the right groin/intertriginous region.  Packing removed.  No purulent drainage expressed.  Size approximately 1/2 dollar size with warmth, erythema, and tenderness to site.  Cleaned, repacked, and covered with iodiform and hypafix.   Patient tolerated. .      Assessment & Plan:    1. Cellulitis and abscess of leg, except foot   2. Class 3 severe obesity with body mass index (BMI) of 40.0 to 44.9 in adult, unspecified obesity type, unspecified whether serious comorbidity present ([place])   3. TSH (thyroid-stimulating hormone deficiency)    Orders Placed This Encounter  Procedures  . CMP14+EGFR  . TSH  . POCT glycosylated hemoglobin (Hb A1C)  . POCT CBC   Meds ordered this encounter    Medications  . mupirocin ointment (BACTROBAN) 2 %    Sig: Place 1 application into the nose 2 (two) times daily.    Dispense:  22 g    Refill:  0     Given hx of recurrent abscesses, it would not unreasonable to evaluate CBC, CMP, and TSh, as well as HgbA1C.  She reports she has looked into weight loss surgery but is not ready yet to commit given the levity of the surgery.  We discussed that unless her eating will truly change, it would be a risky endeavor.  I supported her on the lifestyle changes she is making with her husband for her grandbaby.  She became somewhat tearful.  Will let her go 24 hours and remove packing, take shower.  F/U before the weekend or sooner if symptoms persist or worsen.  She is satisfied with that plan.   A total of 25 minutes were spent face-to-face with the patient during this encounter and over half of that time was spent on counseling and coordination of care.

## 2019-11-27 DIAGNOSIS — Z20828 Contact with and (suspected) exposure to other viral communicable diseases: Secondary | ICD-10-CM | POA: Diagnosis not present

## 2019-12-12 ENCOUNTER — Other Ambulatory Visit: Payer: Self-pay | Admitting: Family Medicine

## 2019-12-12 DIAGNOSIS — F988 Other specified behavioral and emotional disorders with onset usually occurring in childhood and adolescence: Secondary | ICD-10-CM

## 2019-12-12 MED ORDER — AMPHETAMINE-DEXTROAMPHET ER 20 MG PO CP24: 20.0000 mg | ORAL_CAPSULE | Freq: Every day | ORAL | 0 refills | Status: DC

## 2019-12-12 NOTE — Telephone Encounter (Signed)
Pls advise.  

## 2019-12-12 NOTE — Telephone Encounter (Signed)
pmp reviewd, appropriate meds refilled 

## 2019-12-12 NOTE — Telephone Encounter (Signed)
Patient is requesting a refill of the following medications: Requested Prescriptions   Pending Prescriptions Disp Refills   amphetamine-dextroamphetamine (ADDERALL XR) 20 MG 24 hr capsule 30 capsule 0    Sig: Take 1 capsule (20 mg total) by mouth daily.    Date of patient request: 12/12/19 Last office visit: 11/14/19 Date of last refill: 11/14/19 Last refill amount: 30 Follow up time period per chart: 02/09/20

## 2020-01-04 DIAGNOSIS — G4733 Obstructive sleep apnea (adult) (pediatric): Secondary | ICD-10-CM | POA: Diagnosis not present

## 2020-01-11 ENCOUNTER — Other Ambulatory Visit: Payer: Self-pay | Admitting: Family Medicine

## 2020-01-11 DIAGNOSIS — F988 Other specified behavioral and emotional disorders with onset usually occurring in childhood and adolescence: Secondary | ICD-10-CM

## 2020-01-11 NOTE — Telephone Encounter (Signed)
Patient is requesting a refill of the following medications: Requested Prescriptions   Pending Prescriptions Disp Refills   amphetamine-dextroamphetamine (ADDERALL XR) 20 MG 24 hr capsule 30 capsule 0    Sig: Take 1 capsule (20 mg total) by mouth daily.    Date of patient request: 01/11/20 Last office visit: 11/11/19 Date of last refill: 11/14/19 Last refill amount: 30 x3 Follow up time period per chart: 02/09/20

## 2020-01-12 MED ORDER — AMPHETAMINE-DEXTROAMPHET ER 20 MG PO CP24: 20.0000 mg | ORAL_CAPSULE | Freq: Every day | ORAL | 0 refills | Status: DC

## 2020-01-12 NOTE — Telephone Encounter (Signed)
pmp reviewd, appropriate meds refilled 

## 2020-01-30 DIAGNOSIS — G4733 Obstructive sleep apnea (adult) (pediatric): Secondary | ICD-10-CM | POA: Diagnosis not present

## 2020-02-06 ENCOUNTER — Encounter: Payer: Self-pay | Admitting: Adult Health

## 2020-02-09 ENCOUNTER — Encounter: Payer: Self-pay | Admitting: Family Medicine

## 2020-02-09 ENCOUNTER — Other Ambulatory Visit: Payer: Self-pay

## 2020-02-09 ENCOUNTER — Ambulatory Visit (INDEPENDENT_AMBULATORY_CARE_PROVIDER_SITE_OTHER): Payer: BC Managed Care – PPO | Admitting: Family Medicine

## 2020-02-09 VITALS — BP 110/76 | HR 95 | Temp 98.0°F | Ht 61.0 in | Wt 190.4 lb

## 2020-02-09 DIAGNOSIS — Z79899 Other long term (current) drug therapy: Secondary | ICD-10-CM | POA: Diagnosis not present

## 2020-02-09 DIAGNOSIS — F339 Major depressive disorder, recurrent, unspecified: Secondary | ICD-10-CM | POA: Diagnosis not present

## 2020-02-09 DIAGNOSIS — F988 Other specified behavioral and emotional disorders with onset usually occurring in childhood and adolescence: Secondary | ICD-10-CM

## 2020-02-09 MED ORDER — VENLAFAXINE HCL ER 150 MG PO CP24: 150.0000 mg | ORAL_CAPSULE | Freq: Every day | ORAL | 1 refills | Status: DC

## 2020-02-09 MED ORDER — AMPHETAMINE-DEXTROAMPHET ER 20 MG PO CP24: 20.0000 mg | ORAL_CAPSULE | Freq: Every day | ORAL | 0 refills | Status: DC

## 2020-02-09 NOTE — Progress Notes (Signed)
3/11/202110:05 AM  Debbie White [birth year], 39 y.o., female [id number]  Chief Complaint  Patient presents with  . ADD    refills on medication needed, also wants info vaccine for covid  . Depression    HPI:   Patient is a 39 y.o. female with past medical history significant for ADD, OSA on bipap, RLS and depression who presents today for routine followup  Last OV sept 2020 She has been doing well She is very excited about being a first time grandmother She has no acute concerns today She has been working on becoming healthier Has stopped drinking sodas (was drinking about 6-8 a day) and eat more vegetables She is starting to walk a bit She reports that her ADD is well controlled on adderrall XR 20mg  daily, denies any side effects (mood changes, insomnia, palpitations, decreased appetite, chest pain, high BP) She reports that her depression is well controlled on effexor XR 150mg  daily, PHQ9 noted pmp reviewed last refill feb 11th 2021 Will be getting covid vaccine when elegible She neuro next month  Wt Readings from Last 3 Encounters:  02/09/20 190 lb 6.4 oz (86.4 kg)  11/11/19 201 lb 3.2 oz (91.3 kg)  11/09/19 203 lb (92.1 kg)   BP Readings from Last 3 Encounters:  02/09/20 110/76  11/11/19 122/84  11/09/19 128/88    Depression screen PHQ 2/9 02/09/2020 11/11/2019 11/11/2019  Decreased Interest 0 - 0  Down, Depressed, Hopeless 0 0 0  PHQ - 2 Score 0 0 0  Altered sleeping 1 - -  Tired, decreased energy 1 - -  Change in appetite 1 - -  Feeling bad or failure about yourself  1 - -  Trouble concentrating 1 - -  Moving slowly or fidgety/restless 0 - -  Suicidal thoughts 0 - -  PHQ-9 Score 5 - -  Difficult doing work/chores Not difficult at all - -    Fall Risk  02/09/2020 11/11/2019 11/09/2019 11/08/2019 08/12/2019  Falls in the past year? 0 0 0 0 0  Number falls in past yr: 0 0 0 0 0  Injury with Fall? 0 0 0 0 0  Follow up - - Falls evaluation completed - -      No Known Allergies  Prior to Admission medications   Medication Sig Start Date End Date Taking? Authorizing Provider  amphetamine-dextroamphetamine (ADDERALL XR) 20 MG 24 hr capsule Take 1 capsule (20 mg total) by mouth daily. 11/14/19  Yes Rutherford Guys, MD  amphetamine-dextroamphetamine (ADDERALL XR) 20 MG 24 hr capsule Take 1 capsule (20 mg total) by mouth daily. 11/14/19  Yes Rutherford Guys, MD  amphetamine-dextroamphetamine (ADDERALL XR) 20 MG 24 hr capsule Take 1 capsule (20 mg total) by mouth daily. 01/12/20 02/11/20 Yes Rutherford Guys, MD  doxycycline (VIBRA-TABS) 100 MG tablet Take 1 tablet (100 mg total) by mouth 2 (two) times daily. 11/08/19  Yes Rutherford Guys, MD  levonorgestrel (MIRENA) 20 MCG/24HR IUD 1 each by Intrauterine route once.   Yes [provider]  mupirocin ointment (BACTROBAN) 2 % Place 1 application into the nose 2 (two) times daily. 11/09/19  Yes Wendall Mola, NP  pramipexole (MIRAPEX) 0.125 MG tablet Take 2 tablets (0.25 mg total) by mouth at bedtime. TAKE 90-120 MINUTES BEFORE BEDTIME. 10/05/19  Yes Star Age, MD  venlafaxine XR (EFFEXOR-XR) 150 MG 24 hr capsule Take 1 capsule (150 mg total) by mouth daily. 08/12/19  Yes Rutherford Guys, MD    Past  Medical History:  Diagnosis Date  . Cellulitis and abscess of leg 11/15/2019  . Depression     No past surgical history on file.  Social History   Tobacco Use  . Smoking status: Current Every Day Smoker    Types: Cigarettes  . Smokeless tobacco: Never Used  Substance Use Topics  . Alcohol use: Yes    Comment: Socially    Family History  Problem Relation Age of Onset  . Diabetes Other   . Hypertension Other   . Stroke Maternal Grandfather   . Healthy Daughter     ROS Per hpi  OBJECTIVE:  Today's Vitals   02/09/20 0945  BP: 110/76  Pulse: 95  Temp: 98 F (36.7 C)  SpO2: 100%  Weight: 190 lb 6.4 oz (86.4 kg)  Height: 5\' 1"  (1.549 m)   Body mass index is 35.98  kg/m.   Physical Exam Vitals and nursing note reviewed.  Constitutional:      Appearance: She is well-developed.  HENT:     Head: Normocephalic and atraumatic.     Mouth/Throat:     Pharynx: No oropharyngeal exudate.  Eyes:     General: No scleral icterus.    Conjunctiva/sclera: Conjunctivae normal.     Pupils: Pupils are equal, round, and reactive to light.  Cardiovascular:     Rate and Rhythm: Normal rate and regular rhythm.     Heart sounds: Normal heart sounds. No murmur. No friction rub. No gallop.   Pulmonary:     Effort: Pulmonary effort is normal.     Breath sounds: Normal breath sounds. No wheezing or rales.  Musculoskeletal:     Cervical back: Neck supple.     Right lower leg: No edema.     Left lower leg: No edema.  Skin:    General: Skin is warm and dry.  Neurological:     Mental Status: She is alert and oriented to person, place, and time.     No results found for this or any previous visit (from the past 24 hour(s)).  No results found.   ASSESSMENT and PLAN  1. Attention deficit disorder (ADD) without hyperactivity 2. Encounter for medication management Controlled. Continue current regime. Refilled for 3 months. Patient to call for next 3 months of refills.  - ToxASSURE Select 13 (MW), Urine - amphetamine-dextroamphetamine (ADDERALL XR) 20 MG 24 hr capsule; Take 1 capsule (20 mg total) by mouth daily.  3. Episode of recurrent major depressive disorder, unspecified depression episode severity (HCC) Controlled. Continue current regime.  - venlafaxine XR (EFFEXOR-XR) 150 MG 24 hr capsule; Take 1 capsule (150 mg total) by mouth daily.  Other orders - amphetamine-dextroamphetamine (ADDERALL XR) 20 MG 24 hr capsule; Take 1 capsule (20 mg total) by mouth daily. - amphetamine-dextroamphetamine (ADDERALL XR) 20 MG 24 hr capsule; Take 1 capsule (20 mg total) by mouth daily.  Return in about 6 months (around 08/11/2020).    10/11/2020, MD Primary Care  at [facility] [address] [place], [place] Ph.  [phone] Fax [phone]

## 2020-02-09 NOTE — Patient Instructions (Addendum)
  COVID-19 Vaccine Information can be found at: [email or web address] For questions related to vaccine distribution or appointments, please email [email or web address] or call [phone].     If you have lab work done today you will be contacted with your lab results within the next 2 weeks.  If you have not heard from us then please contact us. The fastest way to get your results is to register for My Chart.   IF you received an x-ray today, you will receive an invoice from [facility]. Please contact [facility] at [phone] with questions or concerns regarding your invoice.   IF you received labwork today, you will receive an invoice from [facility]. Please contact [facility] at [phone] with questions or concerns regarding your invoice.   Our billing staff will not be able to assist you with questions regarding bills from these companies.  You will be contacted with the lab results as soon as they are available. The fastest way to get your results is to activate your My Chart account. Instructions are located on the last page of this paperwork. If you have not heard from us regarding the results in 2 weeks, please contact this office.       

## 2020-02-14 LAB — TOXASSURE SELECT 13 (MW), URINE

## 2020-02-17 ENCOUNTER — Ambulatory Visit: Payer: BC Managed Care – PPO | Attending: Internal Medicine

## 2020-02-17 DIAGNOSIS — Z23 Encounter for immunization: Secondary | ICD-10-CM

## 2020-02-17 NOTE — Progress Notes (Signed)
   Covid-19 Vaccination Clinic  Name:  Debbie White    MRN: [id number] DOB: 14-Sep-1981  02/17/2020  [name] was observed post Covid-19 immunization for 15 minutes without incident. She was provided with Vaccine Information Sheet and instruction to access the V-Safe system.   [name] was instructed to call 911 with any severe reactions post vaccine: Marland Kitchen Difficulty breathing  . Swelling of face and throat  . A fast heartbeat  . A bad rash all over body  . Dizziness and weakness   Immunizations Administered    Name Date Dose VIS Date Route   Pfizer COVID-19 Vaccine 02/17/2020  2:45 PM 0.3 mL 11/11/2019 Intramuscular   Manufacturer: [employer], [employer]   Lot: [id number]   NDC: [address]-1

## 2020-03-08 ENCOUNTER — Other Ambulatory Visit: Payer: Self-pay

## 2020-03-08 ENCOUNTER — Ambulatory Visit: Payer: BC Managed Care – PPO | Admitting: Adult Health

## 2020-03-08 ENCOUNTER — Encounter: Payer: Self-pay | Admitting: Adult Health

## 2020-03-08 VITALS — BP 128/84 | HR 88 | Temp 97.3°F | Ht 61.0 in | Wt 190.0 lb

## 2020-03-08 DIAGNOSIS — G4733 Obstructive sleep apnea (adult) (pediatric): Secondary | ICD-10-CM | POA: Diagnosis not present

## 2020-03-08 NOTE — Patient Instructions (Signed)
Your Plan:  Continue CPAP  Will turn EPR on  If your symptoms worsen or you develop new symptoms please let us know.   Thank you for coming to see [place] at [facility]. I hope we have been able to provide you high quality care today.  You may receive a patient satisfaction survey over the next few weeks. We would appreciate your feedback and comments so that we may continue to improve ourselves and the health of our patients.

## 2020-03-08 NOTE — Progress Notes (Signed)
PATIENT: Debbie White DOB: [birth year]  REASON FOR VISIT: follow up HISTORY FROM: patient  HISTORY OF PRESENT ILLNESS: Today 03/08/20: Debbie White is a 39 year old female with a history of obstructive sleep apnea on CPAP.  She returns today for follow-up.  Her download indicates that she use her machine 28 out of 30 days for compliance of 93%.  She use her machine greater than 4 hours each night.  On average she uses her machine 9 hours and 26 minutes.  Her residual AHI is 5.5 on 18/14 centimeters of water.  She reports that the CPAP is working well however she states in the last couple weeks she has woken up with severe stomach pain.  She also states that she is now keeping her granddaughter and reports sometimes she does not use her CPAP for fear of not hearing her if she is crying.  She is interested in in the inspire.    HISTORY 04/14/2019: I reviewed her BiPAP compliance data from 03/13/2019 through 04/11/2019 which is a total of 30 days, during which time she used her machine every night with percent used days greater than 4 hours at 97%, indicating excellent compliance with an average usage of 9 hours and 43 minutes, residual AHI at goal at 3.5/h, leak acceptable with a 95th percentile at 14.9 L/min and a pressure of 19/15.  REVIEW OF SYSTEMS: Out of a complete 14 system review of symptoms, the patient complains only of the following symptoms, and all other reviewed systems are negative.  FSS 44 ESS 3  ALLERGIES: No Known Allergies  HOME MEDICATIONS: Outpatient Medications Prior to Visit  Medication Sig Dispense Refill  . amphetamine-dextroamphetamine (ADDERALL XR) 20 MG 24 hr capsule Take 1 capsule (20 mg total) by mouth daily. 30 capsule 0  . levonorgestrel (MIRENA) 20 MCG/24HR IUD 1 each by Intrauterine route once.    . pramipexole (MIRAPEX) 0.125 MG tablet Take 2 tablets (0.25 mg total) by mouth at bedtime. TAKE 90-120 MINUTES BEFORE BEDTIME. 180 tablet 1  . venlafaxine XR  (EFFEXOR-XR) 150 MG 24 hr capsule Take 1 capsule (150 mg total) by mouth daily. 90 capsule 1  . amphetamine-dextroamphetamine (ADDERALL XR) 20 MG 24 hr capsule Take 1 capsule (20 mg total) by mouth daily. 30 capsule 0  . amphetamine-dextroamphetamine (ADDERALL XR) 20 MG 24 hr capsule Take 1 capsule (20 mg total) by mouth daily. 30 capsule 0   No facility-administered medications prior to visit.    PAST MEDICAL HISTORY: Past Medical History:  Diagnosis Date  . Cellulitis and abscess of leg 11/15/2019  . Depression     PAST SURGICAL HISTORY: No past surgical history on file.  FAMILY HISTORY: Family History  Problem Relation Age of Onset  . Diabetes Other   . Hypertension Other   . Stroke Maternal Grandfather   . Healthy Daughter     SOCIAL HISTORY: Social History   Socioeconomic History  . Marital status: Single    Spouse name: Not on file  . Number of children: Not on file  . Years of education: Not on file  . Highest education level: Not on file  Occupational History  . Not on file  Tobacco Use  . Smoking status: Current Every Day Smoker    Types: Cigarettes  . Smokeless tobacco: Never Used  Substance and Sexual Activity  . Alcohol use: Yes    Comment: Socially  . Drug use: No  . Sexual activity: Yes  Other Topics Concern  . Not  on file  Social History Narrative  . Not on file   Social Determinants of Health   Financial Resource Strain:   . Difficulty of Paying Living Expenses:   Food Insecurity:   . Worried About Programme researcher, broadcasting/film/video in the Last Year:   . Barista in the Last Year:   Transportation Needs:   . Freight forwarder (Medical):   Marland Kitchen Lack of Transportation (Non-Medical):   Physical Activity:   . Days of Exercise per Week:   . Minutes of Exercise per Session:   Stress:   . Feeling of Stress :   Social Connections:   . Frequency of Communication with Friends and Family:   . Frequency of Social Gatherings with Friends and Family:     . Attends Religious Services:   . Active Member of Clubs or Organizations:   . Attends Banker Meetings:   Marland Kitchen Marital Status:   Intimate Partner Violence:   . Fear of Current or Ex-Partner:   . Emotionally Abused:   Marland Kitchen Physically Abused:   . Sexually Abused:       PHYSICAL EXAM  Vitals:   03/08/20 0738  BP: 128/84  Pulse: 88  Temp: (!) 97.3 F (36.3 C)  Weight: 190 lb (86.2 kg)  Height: 5\' 1"  (1.549 m)   Body mass index is 35.9 kg/m.  Generalized: Well developed, in no acute distress  Chest: Lungs clear to auscultation bilaterally  Neurological examination  Mentation: Alert oriented to time, place, history taking. Follows all commands speech and language fluent Cranial nerve II-XII: Extraocular movements were full, visual field were full on confrontational test Head turning and shoulder shrug  were normal and symmetric. Motor: The motor testing reveals 5 over 5 strength of all 4 extremities. Good symmetric motor tone is noted throughout.  Sensory: Sensory testing is intact to soft touch on all 4 extremities. No evidence of extinction is noted.  Gait and station: Gait is normal.    DIAGNOSTIC DATA (LABS, IMAGING, TESTING) - I reviewed patient records, labs, notes, testing and imaging myself where available.  Lab Results  Component Value Date   WBC 12.1 (A) 11/09/2019   HGB 13.6 11/09/2019   HCT 41.4 (A) 11/09/2019   MCV 89.9 11/09/2019   PLT 410 (H) 01/02/2019      Component Value Date/Time   NA 137 11/09/2019 1623   K 4.1 11/09/2019 1623   CL 101 11/09/2019 1623   CO2 23 11/09/2019 1623   GLUCOSE 79 11/09/2019 1623   GLUCOSE 164 (H) 01/02/2019 2310   BUN 7 11/09/2019 1623   CREATININE 0.63 11/09/2019 1623   CALCIUM 9.1 11/09/2019 1623   PROT 7.1 11/09/2019 1623   ALBUMIN 4.3 11/09/2019 1623   AST 14 11/09/2019 1623   ALT 16 11/09/2019 1623   ALKPHOS 95 11/09/2019 1623   BILITOT <0.2 11/09/2019 1623   GFRNONAA 113 11/09/2019 1623   GFRAA  130 11/09/2019 1623   Lab Results  Component Value Date   CHOL 193 10/28/2017   HDL 33 (L) 10/28/2017   LDLCALC 132 (H) 10/28/2017   TRIG 140 10/28/2017   CHOLHDL 5.8 (H) 10/28/2017   Lab Results  Component Value Date   HGBA1C 5.8 (A) 11/09/2019   No results found for: 14/08/2019 Lab Results  Component Value Date   TSH 1.830 11/09/2019      ASSESSMENT AND PLAN 39 y.o. year old female  has a past medical history of Cellulitis and abscess of  leg (11/15/2019) and Depression. here with:  1. OSA on CPAP  - CPAP compliance excellent - Good treatment of AHI  -Will turn on EPR to see if this helps with stomach pain -Interested in inspire however BMI greater than 32 - Encourage patient to use CPAP nightly and > 4 hours each night - F/U in 6 months or sooner if needed   I spent 20 minutes of face-to-face and non-face-to-face time with patient.  This included previsit chart review, lab review, study review, order entry, electronic health record documentation, patient education.  Ward Givens, MSN, NP-C 03/08/2020, 7:53 AM [facility] [address], [place], [place] [address] [phone]

## 2020-03-12 NOTE — Progress Notes (Signed)
COMMUNITY MESSAGE SENT TO Debbie White

## 2020-03-13 ENCOUNTER — Ambulatory Visit: Payer: BC Managed Care – PPO | Attending: Internal Medicine

## 2020-03-13 ENCOUNTER — Other Ambulatory Visit: Payer: Self-pay | Admitting: Family Medicine

## 2020-03-13 DIAGNOSIS — Z23 Encounter for immunization: Secondary | ICD-10-CM

## 2020-03-13 DIAGNOSIS — F988 Other specified behavioral and emotional disorders with onset usually occurring in childhood and adolescence: Secondary | ICD-10-CM

## 2020-03-13 NOTE — Progress Notes (Signed)
   Covid-19 Vaccination Clinic  Name:  Debbie White    MRN: [id number] DOB: 06-Apr-1981  03/13/2020  [name] was observed post Covid-19 immunization for 15 minutes without incident. She was provided with Vaccine Information Sheet and instruction to access the V-Safe system.   [name] was instructed to call 911 with any severe reactions post vaccine: Marland Kitchen Difficulty breathing  . Swelling of face and throat  . A fast heartbeat  . A bad rash all over body  . Dizziness and weakness   Immunizations Administered    Name Date Dose VIS Date Route   Pfizer COVID-19 Vaccine 03/13/2020  2:46 PM 0.3 mL 11/11/2019 Intramuscular   Manufacturer: [employer], [employer]   Lot: [id number]   NDC: [address]-6

## 2020-03-15 MED ORDER — AMPHETAMINE-DEXTROAMPHET ER 20 MG PO CP24: 20.0000 mg | ORAL_CAPSULE | Freq: Every day | ORAL | 0 refills | Status: DC

## 2020-03-15 NOTE — Telephone Encounter (Signed)
pmp reviewd, appropriate meds refilled 

## 2020-04-06 ENCOUNTER — Other Ambulatory Visit: Payer: Self-pay | Admitting: Neurology

## 2020-04-06 DIAGNOSIS — G2581 Restless legs syndrome: Secondary | ICD-10-CM

## 2020-04-18 DIAGNOSIS — G4733 Obstructive sleep apnea (adult) (pediatric): Secondary | ICD-10-CM | POA: Diagnosis not present

## 2020-07-17 DIAGNOSIS — G4733 Obstructive sleep apnea (adult) (pediatric): Secondary | ICD-10-CM | POA: Diagnosis not present

## 2020-08-09 ENCOUNTER — Ambulatory Visit: Payer: BC Managed Care – PPO | Admitting: Family Medicine

## 2020-08-09 ENCOUNTER — Other Ambulatory Visit: Payer: Self-pay | Admitting: Family Medicine

## 2020-08-09 DIAGNOSIS — F339 Major depressive disorder, recurrent, unspecified: Secondary | ICD-10-CM

## 2020-08-09 NOTE — Telephone Encounter (Signed)
   Notes to clinic Has appt today, did not want to refill in case Dr Leretha Pol changes md or dose.

## 2020-08-15 ENCOUNTER — Other Ambulatory Visit: Payer: Self-pay | Admitting: Family Medicine

## 2020-08-15 DIAGNOSIS — F988 Other specified behavioral and emotional disorders with onset usually occurring in childhood and adolescence: Secondary | ICD-10-CM

## 2020-08-15 NOTE — Telephone Encounter (Signed)
Patient is requesting a refill of the following medications: Requested Prescriptions   Pending Prescriptions Disp Refills   amphetamine-dextroamphetamine (ADDERALL XR) 20 MG 24 hr capsule 30 capsule 0    Sig: Take 1 capsule (20 mg total) by mouth daily.    Date of patient request: 08/15/2020 Last office visit: 02/09/2020 Date of last refill: 03/15/2020 Last refill amount: 30capsules Follow up time period per chart: 6 months

## 2020-08-16 MED ORDER — AMPHETAMINE-DEXTROAMPHET ER 20 MG PO CP24: 20.0000 mg | ORAL_CAPSULE | Freq: Every day | ORAL | 0 refills | Status: DC

## 2020-08-16 NOTE — Telephone Encounter (Signed)
pmp reviewd, appropriate meds refilled 

## 2020-08-20 ENCOUNTER — Ambulatory Visit: Payer: BC Managed Care – PPO | Admitting: Family Medicine

## 2020-08-21 ENCOUNTER — Encounter: Payer: Self-pay | Admitting: Family Medicine

## 2020-08-27 ENCOUNTER — Other Ambulatory Visit: Payer: Self-pay

## 2020-08-27 ENCOUNTER — Ambulatory Visit: Payer: BC Managed Care – PPO | Admitting: Family Medicine

## 2020-08-27 ENCOUNTER — Encounter: Payer: Self-pay | Admitting: Family Medicine

## 2020-08-27 VITALS — BP 119/81 | HR 96 | Temp 98.1°F | Ht 61.0 in | Wt 186.0 lb

## 2020-08-27 DIAGNOSIS — F988 Other specified behavioral and emotional disorders with onset usually occurring in childhood and adolescence: Secondary | ICD-10-CM

## 2020-08-27 DIAGNOSIS — R300 Dysuria: Secondary | ICD-10-CM

## 2020-08-27 DIAGNOSIS — F3342 Major depressive disorder, recurrent, in full remission: Secondary | ICD-10-CM

## 2020-08-27 DIAGNOSIS — Z975 Presence of (intrauterine) contraceptive device: Secondary | ICD-10-CM

## 2020-08-27 DIAGNOSIS — Z79899 Other long term (current) drug therapy: Secondary | ICD-10-CM

## 2020-08-27 DIAGNOSIS — R7303 Prediabetes: Secondary | ICD-10-CM

## 2020-08-27 DIAGNOSIS — Z23 Encounter for immunization: Secondary | ICD-10-CM

## 2020-08-27 LAB — POCT URINALYSIS DIP (MANUAL ENTRY)
Bilirubin, UA: NEGATIVE
Blood, UA: NEGATIVE
Glucose, UA: NEGATIVE mg/dL
Ketones, POC UA: NEGATIVE mg/dL
Leukocytes, UA: NEGATIVE
Nitrite, UA: NEGATIVE
Protein Ur, POC: NEGATIVE mg/dL
Spec Grav, UA: 1.025 (ref 1.010–1.025)
Urobilinogen, UA: 0.2 E.U./dL
pH, UA: 7 (ref 5.0–8.0)

## 2020-08-27 MED ORDER — AMPHETAMINE-DEXTROAMPHET ER 20 MG PO CP24
20.0000 mg | ORAL_CAPSULE | Freq: Every day | ORAL | 0 refills | Status: DC
Start: 2020-08-27 — End: 2020-12-26

## 2020-08-27 MED ORDER — AMPHETAMINE-DEXTROAMPHET ER 20 MG PO CP24: 20.0000 mg | ORAL_CAPSULE | Freq: Every day | ORAL | 0 refills | Status: DC

## 2020-08-27 MED ORDER — VENLAFAXINE HCL ER 150 MG PO CP24
ORAL_CAPSULE | ORAL | 1 refills | Status: DC
Start: 2020-08-27 — End: 2020-12-26

## 2020-08-27 NOTE — Patient Instructions (Addendum)

## 2020-08-27 NOTE — Progress Notes (Signed)
9/27/202110:38 AM  Debbie White 1981-10-02, 39 y.o., female [id number]  Chief Complaint  Patient presents with  . medication refills    ADD  . burning w/ uriantion    x 3 days   . referral to gyn    HPI:   Patient is a 39 y.o. female with past medical history significant for ADD, OSA on bipap, RLS and depression who presents today for routine followup  Last OV March 2021 - no changes Last sleep OV April 2021  She is overall doing well She reports that her ADD is doing ok on adderrall XR 20mg  daily She however has noticed that around 3pm effects starting to wear off as she has more work She does not want to adjust at this time as later in the day dosing tends to affect with her sleep denies any side effects (mood changes, insomnia, palpitations, decreased appetite, chest pain, high BP) pmp reviewed UDS done march 2021 She reports that her depression is well controlled on effexor XR 150mg  daily, PHQ9 noted Dysuria - 3-4 days ago, urgency with little output and burning, had UI x 1 has not done anything for this No fever, chills, nausea, vomiting, flank pain, vaginal discharge Last UTI several years ago Needing an obgyn - IUD placed in [place], needs it replaced in March 2021, no bleeding  Lab Results  Component Value Date   HGBA1C 5.8 (A) 11/09/2019    Wt Readings from Last 3 Encounters:  08/27/20 186 lb (84.4 kg)  03/08/20 190 lb (86.2 kg)  02/09/20 190 lb 6.4 oz (86.4 kg)   BP Readings from Last 3 Encounters:  08/27/20 119/81  03/08/20 128/84  02/09/20 110/76    Depression screen PHQ 2/9 08/27/2020 02/09/2020 11/11/2019  Decreased Interest 0 0 -  Down, Depressed, Hopeless 0 0 0  PHQ - 2 Score 0 0 0  Altered sleeping 0 1 -  Tired, decreased energy 0 1 -  Change in appetite 0 1 -  Feeling bad or failure about yourself  0 1 -  Trouble concentrating 0 1 -  Moving slowly or fidgety/restless 0 0 -  Suicidal thoughts 0 0 -  PHQ-9 Score 0 5 -  Difficult  doing work/chores Not difficult at all Not difficult at all -    Fall Risk  08/27/2020 02/09/2020 11/11/2019 11/09/2019 11/08/2019  Falls in the past year? 0 0 0 0 0  Number falls in past yr: 0 0 0 0 0  Injury with Fall? 0 0 0 0 0  Follow up Falls evaluation completed - - Falls evaluation completed -     No Known Allergies  Prior to Admission medications   Medication Sig Start Date End Date Taking? Authorizing Provider  amphetamine-dextroamphetamine (ADDERALL XR) 20 MG 24 hr capsule Take 1 capsule (20 mg total) by mouth daily. Ok to fill 30 days after sign 03/15/20  Yes 14/07/2019, MD  amphetamine-dextroamphetamine (ADDERALL XR) 20 MG 24 hr capsule Take 1 capsule (20 mg total) by mouth daily. Ok to fill 60 days after sign 03/15/20  Yes Myles Lipps, MD  amphetamine-dextroamphetamine (ADDERALL XR) 20 MG 24 hr capsule Take 1 capsule (20 mg total) by mouth daily. 08/16/20 09/15/20 Yes 08/18/20, MD  levonorgestrel (MIRENA) 20 MCG/24HR IUD 1 each by Intrauterine route once.   Yes [provider]  pramipexole (MIRAPEX) 0.125 MG tablet TAKE 2 TABLETS (0.25 MG TOTAL) BY MOUTH AT BEDTIME. TAKE 90-120 MINUTES BEFORE BEDTIME. 04/09/20  Yes  Huston Foley, MD  venlafaxine XR (EFFEXOR-XR) 150 MG 24 hr capsule TAKE 1 CAPSULE BY MOUTH EVERY DAY 08/09/20  Yes Myles Lipps, MD    Past Medical History:  Diagnosis Date  . Cellulitis and abscess of leg 11/15/2019  . Depression     No past surgical history on file.  Social History   Tobacco Use  . Smoking status: Current Every Day Smoker    Types: Cigarettes  . Smokeless tobacco: Never Used  Substance Use Topics  . Alcohol use: Yes    Comment: Socially    Family History  Problem Relation Age of Onset  . Diabetes Other   . Hypertension Other   . Stroke Maternal Grandfather   . Healthy Daughter     ROS Per hpi  OBJECTIVE:  Today's Vitals   08/27/20 1024  BP: 119/81  Pulse: 96  Temp: 98.1 F (36.7 C)  SpO2: 98%    Weight: 186 lb (84.4 kg)  Height: 5\' 1"  (1.549 m)   Body mass index is 35.14 kg/m.   Physical Exam Vitals and nursing note reviewed.  Constitutional:      Appearance: She is well-developed.  HENT:     Head: Normocephalic and atraumatic.  Eyes:     General: No scleral icterus.    Conjunctiva/sclera: Conjunctivae normal.     Pupils: Pupils are equal, round, and reactive to light.  Pulmonary:     Effort: Pulmonary effort is normal.  Musculoskeletal:     Cervical back: Neck supple.  Skin:    General: Skin is warm and dry.  Neurological:     Mental Status: She is alert and oriented to person, place, and time.     Results for orders placed or performed in visit on 08/27/20 (from the past 24 hour(s))  POCT urinalysis dipstick     Status: None   Collection Time: 08/27/20 10:54 AM  Result Value Ref Range   Color, UA yellow yellow   Clarity, UA clear clear   Glucose, UA negative negative mg/dL   Bilirubin, UA negative negative   Ketones, POC UA negative negative mg/dL   Spec Grav, UA 08/29/20 2.637 - 1.025   Blood, UA negative negative   pH, UA 7.0 5.0 - 8.0   Protein Ur, POC negative negative mg/dL   Urobilinogen, UA 0.2 0.2 or 1.0 E.U./dL   Nitrite, UA Negative Negative   Leukocytes, UA Negative Negative    No results found.   ASSESSMENT and PLAN  1. Attention deficit disorder (ADD) without hyperactivity Overall stable. Re-eval need for afternoon dose at next OV. pmp reviewed. meds refilled - amphetamine-dextroamphetamine (ADDERALL XR) 20 MG 24 hr capsule; Take 1 capsule (20 mg total) by mouth daily.  2. Encounter for medication management  3. Recurrent major depressive disorder, in full remission (HCC) Stable. Cont current regime  4. Dysuria - POCT urinalysis dipstick - no infection, discussed OAB? Trial of AZO  - Urine Culture  5. IUD (intrauterine device) in place - Ambulatory referral to Obstetrics / Gynecology  6. Prediabetes Recheck a1c at next  OV  Other orders - Flu Vaccine QUAD 36+ mos IM - amphetamine-dextroamphetamine (ADDERALL XR) 20 MG 24 hr capsule; Take 1 capsule (20 mg total) by mouth daily. Ok to fill 30 days after sign - amphetamine-dextroamphetamine (ADDERALL XR) 20 MG 24 hr capsule; Take 1 capsule (20 mg total) by mouth daily. Ok to fill 60 days after sign - venlafaxine XR (EFFEXOR-XR) 150 MG 24 hr capsule; TAKE 1 CAPSULE  BY MOUTH EVERY DAY  Return in about 3 months (around 11/26/2020) for TOC .    Myles Lipps, MD Primary Care at [facility] [address] [place], [place] [address] Ph.  [phone] Fax [phone]

## 2020-08-28 LAB — URINE CULTURE

## 2020-09-12 ENCOUNTER — Encounter: Payer: Self-pay | Admitting: Adult Health

## 2020-09-12 ENCOUNTER — Ambulatory Visit: Payer: BC Managed Care – PPO | Admitting: Adult Health

## 2020-09-12 VITALS — BP 123/82 | HR 86 | Ht 61.0 in | Wt 184.8 lb

## 2020-09-12 DIAGNOSIS — G4733 Obstructive sleep apnea (adult) (pediatric): Secondary | ICD-10-CM | POA: Diagnosis not present

## 2020-09-12 NOTE — Patient Instructions (Signed)
Continue using BiPAP nightly and greater than 4 hours each night If your symptoms worsen or you develop new symptoms please let us know.    

## 2020-09-12 NOTE — Progress Notes (Addendum)
PATIENT: Debbie White DOB: 1981/01/10  REASON FOR VISIT: follow up HISTORY FROM: patient  HISTORY OF PRESENT ILLNESS:  Today 09/12/20:  Debbie White is a 39 year old female with a history of obstructive sleep apnea on BiPAP.  She returns today for follow-up.  Her download indicates that she use her machine 30 out of the last 60 days for compliance of 50%.  She is averaging greater than 4 hours 26 days for compliance of 43%.  Residual AHI is 4 on 18/14 centimeters of water.  Leak in the 95th percentile is 30.2 L/min. She is interested in the inspire device. She reports that she has a hard time using Bipap.   HISTORY 03/08/20: Debbie White is a 39 year old female with a history of obstructive sleep apnea on CPAP.  She returns today for follow-up.  Her download indicates that she use her machine 28 out of 30 days for compliance of 93%.  She use her machine greater than 4 hours each night.  On average she uses her machine 9 hours and 26 minutes.  Her residual AHI is 5.5 on 18/14 centimeters of water.  She reports that the CPAP is working well however she states in the last couple weeks she has woken up with severe stomach pain.  She also states that she is now keeping her granddaughter and reports sometimes she does not use her CPAP for fear of not hearing her if she is crying.  She is interested in in the inspire   REVIEW OF SYSTEMS: Out of a complete 14 system review of symptoms, the patient complains only of the following symptoms, and all other reviewed systems are negative.  See HPI  ALLERGIES: No Known Allergies  HOME MEDICATIONS: Outpatient Medications Prior to Visit  Medication Sig Dispense Refill  . amphetamine-dextroamphetamine (ADDERALL XR) 20 MG 24 hr capsule Take 1 capsule (20 mg total) by mouth daily. Ok to fill 30 days after sign 30 capsule 0  . amphetamine-dextroamphetamine (ADDERALL XR) 20 MG 24 hr capsule Take 1 capsule (20 mg total) by mouth daily. Ok to fill 60 days  after sign 30 capsule 0  . amphetamine-dextroamphetamine (ADDERALL XR) 20 MG 24 hr capsule Take 1 capsule (20 mg total) by mouth daily. 30 capsule 0  . levonorgestrel (MIRENA) 20 MCG/24HR IUD 1 each by Intrauterine route once.    . pramipexole (MIRAPEX) 0.125 MG tablet TAKE 2 TABLETS (0.25 MG TOTAL) BY MOUTH AT BEDTIME. TAKE 90-120 MINUTES BEFORE BEDTIME. 180 tablet 1  . venlafaxine XR (EFFEXOR-XR) 150 MG 24 hr capsule TAKE 1 CAPSULE BY MOUTH EVERY DAY 90 capsule 1   No facility-administered medications prior to visit.    PAST MEDICAL HISTORY: Past Medical History:  Diagnosis Date  . Cellulitis and abscess of leg 11/15/2019  . Depression     PAST SURGICAL HISTORY: No past surgical history on file.  FAMILY HISTORY: Family History  Problem Relation Age of Onset  . Diabetes Other   . Hypertension Other   . Stroke Maternal Grandfather   . Healthy Daughter     SOCIAL HISTORY: Social History   Socioeconomic History  . Marital status: Single    Spouse name: Not on file  . Number of children: Not on file  . Years of education: Not on file  . Highest education level: Not on file  Occupational History  . Not on file  Tobacco Use  . Smoking status: Current Every Day Smoker    Types: Cigarettes  . Smokeless tobacco:  Never Used  Vaping Use  . Vaping Use: Every day  . Devices: nicotene vape  Substance and Sexual Activity  . Alcohol use: Yes    Comment: Socially  . Drug use: No  . Sexual activity: Yes  Other Topics Concern  . Not on file  Social History Narrative  . Not on file   Social Determinants of Health   Financial Resource Strain:   . Difficulty of Paying Living Expenses: Not on file  Food Insecurity:   . Worried About Programme researcher, broadcasting/film/video in the Last Year: Not on file  . Ran Out of Food in the Last Year: Not on file  Transportation Needs:   . Lack of Transportation (Medical): Not on file  . Lack of Transportation (Non-Medical): Not on file  Physical Activity:    . Days of Exercise per Week: Not on file  . Minutes of Exercise per Session: Not on file  Stress:   . Feeling of Stress : Not on file  Social Connections:   . Frequency of Communication with Friends and Family: Not on file  . Frequency of Social Gatherings with Friends and Family: Not on file  . Attends Religious Services: Not on file  . Active Member of Clubs or Organizations: Not on file  . Attends Banker Meetings: Not on file  . Marital Status: Not on file  Intimate Partner Violence:   . Fear of Current or Ex-Partner: Not on file  . Emotionally Abused: Not on file  . Physically Abused: Not on file  . Sexually Abused: Not on file      PHYSICAL EXAM  Vitals:   09/12/20 1117  BP: 123/82  Pulse: 86  Weight: 184 lb 12.8 oz (83.8 kg)  Height: 5\' 1"  (1.549 m)   Body mass index is 34.92 kg/m.  Generalized: Well developed, in no acute distress  Chest: Lungs clear to auscultation bilaterally  Neurological examination  Mentation: Alert oriented to time, place, history taking. Follows all commands speech and language fluent Cranial nerve II-XII: Extraocular movements were full, visual field were full on confrontational test Head turning and shoulder shrug  were normal and symmetric. Motor: The motor testing reveals 5 over 5 strength of all 4 extremities. Good symmetric motor tone is noted throughout.  Sensory: Sensory testing is intact to soft touch on all 4 extremities. No evidence of extinction is noted.  Gait and station: Gait is normal.    DIAGNOSTIC DATA (LABS, IMAGING, TESTING) - I reviewed patient records, labs, notes, testing and imaging myself where available.  Lab Results  Component Value Date   WBC 12.1 (A) 11/09/2019   HGB 13.6 11/09/2019   HCT 41.4 (A) 11/09/2019   MCV 89.9 11/09/2019   PLT 410 (H) 01/02/2019      Component Value Date/Time   NA 137 11/09/2019 1623   K 4.1 11/09/2019 1623   CL 101 11/09/2019 1623   CO2 23 11/09/2019 1623     GLUCOSE 79 11/09/2019 1623   GLUCOSE 164 (H) 01/02/2019 2310   BUN 7 11/09/2019 1623   CREATININE 0.63 11/09/2019 1623   CALCIUM 9.1 11/09/2019 1623   PROT 7.1 11/09/2019 1623   ALBUMIN 4.3 11/09/2019 1623   AST 14 11/09/2019 1623   ALT 16 11/09/2019 1623   ALKPHOS 95 11/09/2019 1623   BILITOT <0.2 11/09/2019 1623   GFRNONAA 113 11/09/2019 1623   GFRAA 130 11/09/2019 1623   Lab Results  Component Value Date   CHOL 193 10/28/2017  HDL 33 (L) 10/28/2017   LDLCALC 132 (H) 10/28/2017   TRIG 140 10/28/2017   CHOLHDL 5.8 (H) 10/28/2017   Lab Results  Component Value Date   HGBA1C 5.8 (A) 11/09/2019    Lab Results  Component Value Date   TSH 1.830 11/09/2019      ASSESSMENT AND PLAN 39 y.o. year old female  has a past medical history of Cellulitis and abscess of leg (11/15/2019) and Depression. here with:  1. OSA on CPAP  - CPAP compliance suboptimal  - Good treatment of AHI when she uses the machine - Encourage patient to use CPAP nightly and > 4 hours each night -Discussed inspire device with Dr. Katharina Caper agrees that this would be a good candidate.  I will make a referral to ENT for evaluation - F/U in 1 year or sooner if needed   I spent 25 minutes of face-to-face and non-face-to-face time with patient.  This included previsit chart review, lab review, study review, order entry, electronic health record documentation, patient education.  Butch Penny, MSN, NP-C 09/12/2020, 12:25 PM [facility] [address] [place], [place] [address] [phone]    I reviewed the above note and documentation by the Nurse Practitioner and agree with the history, exam, assessment and plan as outlined above. I was available for consultation. Huston Foley, MD, PhD [facility])

## 2020-09-20 ENCOUNTER — Encounter: Payer: Self-pay | Admitting: Adult Health

## 2020-10-01 ENCOUNTER — Other Ambulatory Visit: Payer: Self-pay | Admitting: Neurology

## 2020-10-01 DIAGNOSIS — G2581 Restless legs syndrome: Secondary | ICD-10-CM

## 2020-10-15 DIAGNOSIS — G4733 Obstructive sleep apnea (adult) (pediatric): Secondary | ICD-10-CM | POA: Diagnosis not present

## 2020-10-16 ENCOUNTER — Telehealth: Payer: Self-pay

## 2020-10-16 NOTE — Telephone Encounter (Signed)
Received notes from Dr. Thurmon Fair office from pt's visit 10/15/2020. Pt will need a new sleep study and he asked our office to schedule this to recheck AHI. Pt will also work on weight loss.  I called pt. She would like to wait to schedule an office visit with Dr. Frances Furbish to discuss a repeat sleep study until end of December so she can work on weight loss. An appt was scheduled for 11/26/2020 at 10:30am with Dr. Frances Furbish. Pt will let us know if she would like this appt pushed out to give her more time to lose weight. Pt verbalized understanding of new appt date and time.

## 2020-10-17 NOTE — Telephone Encounter (Signed)
Thank you for the notification.  I reviewed Dr. Thurmon Fair record.

## 2020-10-23 ENCOUNTER — Encounter: Payer: Self-pay | Admitting: Advanced Practice Midwife

## 2020-10-23 ENCOUNTER — Other Ambulatory Visit: Payer: Self-pay

## 2020-10-23 ENCOUNTER — Ambulatory Visit (INDEPENDENT_AMBULATORY_CARE_PROVIDER_SITE_OTHER): Payer: BC Managed Care – PPO | Admitting: Advanced Practice Midwife

## 2020-10-23 ENCOUNTER — Other Ambulatory Visit (HOSPITAL_COMMUNITY)
Admission: RE | Admit: 2020-10-23 | Discharge: 2020-10-23 | Disposition: A | Discharge status: Home / Self Care | Payer: BC Managed Care – PPO | Source: Ambulatory Visit | Attending: Advanced Practice Midwife | Admitting: Advanced Practice Midwife

## 2020-10-23 VITALS — BP 128/82 | HR 87 | Ht 61.0 in | Wt 185.3 lb

## 2020-10-23 DIAGNOSIS — Z30431 Encounter for routine checking of intrauterine contraceptive device: Secondary | ICD-10-CM

## 2020-10-23 DIAGNOSIS — Z01419 Encounter for gynecological examination (general) (routine) without abnormal findings: Secondary | ICD-10-CM | POA: Diagnosis not present

## 2020-10-23 DIAGNOSIS — R87610 Atypical squamous cells of undetermined significance on cytologic smear of cervix (ASC-US): Secondary | ICD-10-CM | POA: Diagnosis not present

## 2020-10-23 DIAGNOSIS — Z1239 Encounter for other screening for malignant neoplasm of breast: Secondary | ICD-10-CM | POA: Diagnosis not present

## 2020-10-23 DIAGNOSIS — L680 Hirsutism: Secondary | ICD-10-CM

## 2020-10-23 NOTE — Progress Notes (Signed)
GYNECOLOGY ANNUAL PREVENTATIVE CARE ENCOUNTER NOTE  Subjective:   Debbie White is a 39 y.o. No obstetric history on file. female here for a routine annual gynecologic exam.  Current complaints: increased facial hair, decreased libido.   Denies abnormal vaginal bleeding, discharge, pelvic pain, problems with intercourse or other gynecologic concerns.    Gynecologic History No LMP recorded. (Menstrual status: IUD). Contraception: IUD currently has Mirena in place. This is due to come out in March 2022. She is interested in a lower hormone option for birth control.  Last Pap: approx 5 years ago. Results were: patient reports that she no history of abnormal paps  Last mammogram: approx 3 years ago. Results were: normal per patient report   Obstetric History OB History  Gravida Para Term Preterm AB Living  3 2 2   1 2   SAB TAB Ectopic Multiple Live Births  1       2    # Outcome Date GA Lbr Len/2nd Weight Sex Delivery Anes PTL Lv  3 SAB           2 Term           1 Term             Past Medical History:  Diagnosis Date  . Cellulitis and abscess of leg 11/15/2019  . Depression     History reviewed. No pertinent surgical history.  Current Outpatient Medications on File Prior to Visit  Medication Sig Dispense Refill  . amphetamine-dextroamphetamine (ADDERALL XR) 20 MG 24 hr capsule Take 1 capsule (20 mg total) by mouth daily. Ok to fill 30 days after sign 30 capsule 0  . amphetamine-dextroamphetamine (ADDERALL XR) 20 MG 24 hr capsule Take 1 capsule (20 mg total) by mouth daily. Ok to fill 60 days after sign 30 capsule 0  . levonorgestrel (MIRENA) 20 MCG/24HR IUD 1 each by Intrauterine route once.    . pramipexole (MIRAPEX) 0.125 MG tablet TAKE 2 TABLETS (0.25 MG TOTAL) BY MOUTH AT BEDTIME. TAKE 90-120 MINUTES BEFORE BEDTIME. 180 tablet 1  . venlafaxine XR (EFFEXOR-XR) 150 MG 24 hr capsule TAKE 1 CAPSULE BY MOUTH EVERY DAY 90 capsule 1  . amphetamine-dextroamphetamine (ADDERALL XR)  20 MG 24 hr capsule Take 1 capsule (20 mg total) by mouth daily. 30 capsule 0   No current facility-administered medications on file prior to visit.    No Known Allergies  Social History   Socioeconomic History  . Marital status: Single    Spouse name: Not on file  . Number of children: Not on file  . Years of education: Not on file  . Highest education level: Not on file  Occupational History  . Not on file  Tobacco Use  . Smoking status: Current Every Day Smoker    Types: Cigarettes  . Smokeless tobacco: Never Used  Vaping Use  . Vaping Use: Every day  . Devices: nicotene vape  Substance and Sexual Activity  . Alcohol use: Yes    Comment: Socially  . Drug use: No  . Sexual activity: Yes  Other Topics Concern  . Not on file  Social History Narrative  . Not on file   Social Determinants of Health   Financial Resource Strain:   . Difficulty of Paying Living Expenses: Not on file  Food Insecurity:   . Worried About 11/17/2019 in the Last Year: Not on file  . Ran Out of Food in the Last Year: Not on file  Transportation  Needs:   . Lack of Transportation (Medical): Not on file  . Lack of Transportation (Non-Medical): Not on file  Physical Activity:   . Days of Exercise per Week: Not on file  . Minutes of Exercise per Session: Not on file  Stress:   . Feeling of Stress : Not on file  Social Connections:   . Frequency of Communication with Friends and Family: Not on file  . Frequency of Social Gatherings with Friends and Family: Not on file  . Attends Religious Services: Not on file  . Active Member of Clubs or Organizations: Not on file  . Attends Banker Meetings: Not on file  . Marital Status: Not on file  Intimate Partner Violence:   . Fear of Current or Ex-Partner: Not on file  . Emotionally Abused: Not on file  . Physically Abused: Not on file  . Sexually Abused: Not on file    Family History  Problem Relation Age of Onset  .  Diabetes Other   . Hypertension Other   . Stroke Maternal Grandfather   . Healthy Daughter     The following portions of the patient's history were reviewed and updated as appropriate: allergies, current medications, past family history, past medical history, past social history, past surgical history and problem list.  Review of Systems Pertinent items noted in HPI and remainder of comprehensive ROS otherwise negative.   Objective:  BP 128/82   Pulse 87   Ht 5\' 1"  (1.549 m)   Wt 185 lb 4.8 oz (84.1 kg)   BMI 35.01 kg/m  CONSTITUTIONAL: Well-developed, well-nourished female in no acute distress.  HENT:  Normocephalic, atraumatic, External right and left ear normal. Oropharynx is clear and moist EYES: Conjunctivae and EOM are normal. Pupils are equal, round, and reactive to light. No scleral icterus.  NECK: Normal range of motion, supple, no masses.  Normal thyroid.  SKIN: Skin is warm and dry. No rash noted. Not diaphoretic. No erythema. No pallor. NEUROLOGIC: Alert and oriented to person, place, and time. Normal reflexes, muscle tone coordination. No cranial nerve deficit noted. PSYCHIATRIC: Normal mood and affect. Normal behavior. Normal judgment and thought content. CARDIOVASCULAR: Normal heart rate noted, regular rhythm RESPIRATORY: Clear to auscultation bilaterally. Effort and breath sounds normal, no problems with respiration noted. BREASTS: Symmetric in size. No masses, skin changes, nipple drainage, or lymphadenopathy. ABDOMEN: Soft, normal bowel sounds, no distention noted.  No tenderness, rebound or guarding.  PELVIC: Normal appearing external genitalia; normal appearing vaginal mucosa and cervix.  No abnormal discharge noted.  Pap smear obtained.  Normal uterine size, no other palpable masses, no uterine or adnexal tenderness. MUSCULOSKELETAL: Normal range of motion. No tenderness.  No cyanosis, clubbing, or edema.  2+ distal pulses.   Assessment and Plan:  1. Well woman  exam with routine gynecological exam - Cytology - PAP( [facility]) - Lipid panel - CBC/CMET  2. Breast screening - MM Digital Screening; Future  3. Hirsutism - Testosterone free and total   4. Contraception counseling - Reviewed other LARC options including Skyla and Paragard. - Patient is interested in a lower hormone option than the Mirena.  - Mirena is due to be removed March 2022, patient aware she can return any time to switch IUD once a decision has been made.   Will follow up results of pap smear and manage accordingly. Mammogram scheduled Routine preventative health maintenance measures emphasized. Please refer to After Visit Summary for other counseling recommendations.    04-07-1975  Suezanne Jacquet, CNM  10/23/20  3:08 PM

## 2020-10-24 ENCOUNTER — Ambulatory Visit
Admission: RE | Admit: 2020-10-24 | Discharge: 2020-10-24 | Disposition: A | Discharge status: Home / Self Care | Payer: BC Managed Care – PPO | Source: Ambulatory Visit | Attending: Advanced Practice Midwife | Admitting: Advanced Practice Midwife

## 2020-10-24 DIAGNOSIS — Z1239 Encounter for other screening for malignant neoplasm of breast: Secondary | ICD-10-CM

## 2020-10-24 DIAGNOSIS — Z1231 Encounter for screening mammogram for malignant neoplasm of breast: Secondary | ICD-10-CM | POA: Diagnosis not present

## 2020-10-26 DIAGNOSIS — G4733 Obstructive sleep apnea (adult) (pediatric): Secondary | ICD-10-CM | POA: Diagnosis not present

## 2020-10-29 ENCOUNTER — Other Ambulatory Visit: Payer: BC Managed Care – PPO

## 2020-10-29 ENCOUNTER — Other Ambulatory Visit: Payer: Self-pay

## 2020-10-29 DIAGNOSIS — Z01419 Encounter for gynecological examination (general) (routine) without abnormal findings: Secondary | ICD-10-CM | POA: Diagnosis not present

## 2020-10-29 DIAGNOSIS — L680 Hirsutism: Secondary | ICD-10-CM | POA: Diagnosis not present

## 2020-10-31 ENCOUNTER — Telehealth: Payer: Self-pay | Admitting: Lactation Services

## 2020-10-31 LAB — CYTOLOGY - PAP
Comment: NEGATIVE
Diagnosis: UNDETERMINED — AB
High risk HPV: NEGATIVE

## 2020-10-31 NOTE — Telephone Encounter (Signed)
Returned patients call. Patient called with concerns about the results of pap smear.   Reviewed results and informed patient I will forward to Thressa Sheller, CNM to read results and to message patient.   Reviewed she her pap did show some mildly abnormal cells with there HPV is negative which is good. Reviewed provider would give recommendation of when to follow up with another pap.   Patient voiced understanding.

## 2020-11-03 LAB — CBC
Hematocrit: 43.8 % (ref 34.0–46.6)
Hemoglobin: 14.6 g/dL (ref 11.1–15.9)
MCH: 29.7 pg (ref 26.6–33.0)
MCHC: 33.3 g/dL (ref 31.5–35.7)
MCV: 89 fL (ref 79–97)
Platelets: 352 10*3/uL (ref 150–450)
RBC: 4.92 x10E6/uL (ref 3.77–5.28)
RDW: 11.8 % (ref 11.7–15.4)
WBC: 7.5 10*3/uL (ref 3.4–10.8)

## 2020-11-03 LAB — COMPREHENSIVE METABOLIC PANEL
ALT: 20 IU/L (ref 0–32)
AST: 16 IU/L (ref 0–40)
Albumin/Globulin Ratio: 1.8 (ref 1.2–2.2)
Albumin: 4.4 g/dL (ref 3.8–4.8)
Alkaline Phosphatase: 85 IU/L (ref 44–121)
BUN/Creatinine Ratio: 12 (ref 9–23)
BUN: 8 mg/dL (ref 6–24)
Bilirubin Total: 0.3 mg/dL (ref 0.0–1.2)
CO2: 23 mmol/L (ref 20–29)
Calcium: 9.1 mg/dL (ref 8.7–10.2)
Chloride: 104 mmol/L (ref 96–106)
Creatinine, Ser: 0.68 mg/dL (ref 0.57–1.00)
GFR calc Af Amer: 126 mL/min/{1.73_m2} (ref >59)
GFR calc non Af Amer: 109 mL/min/{1.73_m2} (ref >59)
Globulin, Total: 2.5 g/dL (ref 1.5–4.5)
Glucose: 92 mg/dL (ref 65–99)
Potassium: 4.2 mmol/L (ref 3.5–5.2)
Sodium: 141 mmol/L (ref 134–144)
Total Protein: 6.9 g/dL (ref 6.0–8.5)

## 2020-11-03 LAB — LIPID PANEL
Chol/HDL Ratio: 5.2 ratio — ABNORMAL HIGH (ref 0.0–4.4)
Cholesterol, Total: 187 mg/dL (ref 100–199)
HDL: 36 mg/dL — ABNORMAL LOW (ref >39)
LDL Chol Calc (NIH): 135 mg/dL — ABNORMAL HIGH (ref 0–99)
Triglycerides: 85 mg/dL (ref 0–149)
VLDL Cholesterol Cal: 16 mg/dL (ref 5–40)

## 2020-11-03 LAB — TESTOSTERONE, FREE: Testosterone, Free: 2.6 pg/mL (ref 0.0–4.2)

## 2020-11-03 LAB — SEX HORMONE BINDING GLOBULIN: Sex Hormone Binding: 19 nmol/L — ABNORMAL LOW (ref 24.6–122.0)

## 2020-11-03 LAB — TESTOSTERONE: Testosterone: 17 ng/dL (ref 4–50)

## 2020-11-03 LAB — TESTOSTERONE, % FREE: Testosterone-% Free: 1.5 %

## 2020-11-21 ENCOUNTER — Other Ambulatory Visit: Payer: Self-pay | Admitting: Advanced Practice Midwife

## 2020-11-26 ENCOUNTER — Encounter: Payer: Self-pay | Admitting: Neurology

## 2020-11-26 ENCOUNTER — Ambulatory Visit: Payer: BC Managed Care – PPO | Admitting: Neurology

## 2020-11-26 ENCOUNTER — Other Ambulatory Visit: Payer: Self-pay

## 2020-11-26 VITALS — BP 132/88 | HR 100 | Ht 61.0 in | Wt 185.0 lb

## 2020-11-26 DIAGNOSIS — G4733 Obstructive sleep apnea (adult) (pediatric): Secondary | ICD-10-CM | POA: Diagnosis not present

## 2020-11-26 NOTE — Progress Notes (Signed)
Subjective:    Patient ID: Debbie White is a 39 y.o. female.  HPI     Interim history:   Debbie White is a 39 year old right-handed woman with an underlying medical history of depression, ADD, smoking and obesity, who presents for follow-up consultation of her obstructive sleep apnea, on BiPAP therapy.  The patient is unaccompanied today. I saw her in a VV on 04/14/2019, at which time she was compliant with her BiPAP and reported benefit from it.  She saw Ward Givens, nurse practitioner in the interim on 03/08/2020, at which time she was compliant with BiPAP but reported that she needed to be able to hear her granddaughter as she was keeping her.  She saw Ward Givens, NP again on 09/12/2020, at which time her compliance had declined.  She had indicated interest in inspire in April and again during this visit.  She was referred to ENT.  She had an interim consultation with Dr. Wilburn Cornelia on 10/15/2020.  I reviewed the office note.  Her AHI was above the threshold of 65/h.  She was also encouraged to lose weight for the goal of her BMI to be less than 32.  Today, 11/26/2020: I reviewed her BiPAP compliance data from 10/26/2020 through 11/24/2020, which is a total of 30 days, during which time she used her machine 21 days with percent use days greater than 4 hours at 70%, indicating adequate compliance with an average usage of 7 hours and 56 minutes, residual AHI 4.4/h, pressure of 18/14 centimeters, leak on the low side with a 95th percentile at 2.5 L/min.  She reports having a love-hate relationship with her BiPAP.  Sometimes she skips using it, particularly on the weekends, as she can sleep in a little longer and take a nap if needed.  She does admit that she does not sleep as well and does not feel as good the next day when she does not use her BiPAP.  She is working on weight loss.  She is agreeable to maintaining BiPAP therapy until she has achieved more weight loss and would be interested  in pursuing another sleep test for reevaluation. She is still interested in pursuing inspire therapy longer-term.  The patient's allergies, current medications, family history, past medical history, past social history, past surgical history and problem list were reviewed and updated as appropriate.    Previously (copied from previous notes for reference):   I reviewed her BiPAP compliance data from 03/13/2019 through 04/11/2019 which is a total of 30 days, during which time she used her machine every night with percent used days greater than 4 hours at 97%, indicating excellent compliance with an average usage of 9 hours and 43 minutes, residual AHI at goal at 3.5/h, leak acceptable with a 95th percentile at 14.9 L/min and a pressure of 19/15.    I saw her on 10/14/2018, at which time she was compliant with her BiPAP, but she was having difficulty tolerating the pressure at that level.  We mutually agreed to try to reduce the pressure.  She was motivated to work on weight loss and had looked into bariatric options.      I saw her on 05/11/2018, at which time she was compliant with AutoPap but she had residual sleep disordered breathing and subsequently also an abnormal overnight pulse oximetry test after which I asked her to return for a full night titration study. She had this on 06/01/2018. I went over her test results with her in detail today. Sleep  efficiency was markedly reduced at 56.1%, sleep latency delayed at 174 minutes and REM sleep was absent for the study. She was started on CPAP and titrated to a pressure of 15 cm but because of higher pressure requirements she was switched to BiPAP from 17/13 cm to 22/18 cm. On the pressure of 21/17 her AHI was 0 per hour with supine non-REM sleep achieved an O2 nadir of 89%.    I reviewed her BiPAP compliance data from 09/13/2018 through 10/12/2018 which is a total of 30 days, during which time she used her machine every night with percent used days  greater than 4 hours at 100%, indicating superb compliance with an average usage of 9 hours and 20 minutes, residual AHI at goal at 3.6 per hour, leak on the higher end with the 95th percentile at 21.8 L/m on a pressure of 21/17 cm.    I first met her on 01/27/2018 at the request of her primary care PA, at which time she reported snoring, daytime somnolence as well as witnessed apneas. She was advised to proceed with a sleep study. Her insurance denied a lab attended sleep study. She had a home sleep test on 02/17/2018 which indicated severe sleep apnea with an AHI of 76 per hour, average oxygen saturation of 87%, nadir of 70% or below. Despite the severity of her sleep apnea, she was not approved for a lab attended sleep study and was advised to proceed with AutoPap therapy at home.   I reviewed her AutoPap compliance data from 04/10/2018 through 05/09/2014 which is a total of 30 days, during which time she used her AutoPap every night with percent used days greater than 4 hours at 9 hours and 28 minutes, residual AHI suboptimal 9 per hour, mostly secondary to obstructive events, 95th percentile pressure at 15.8 cm, leak acceptable with the 95th percentile at 11.9 L/m on a pressure range of 7 cm to 16 cm with EPR.    01/27/2018: (She) reports snoring and excessive daytime somnolence as well as witnessed breathing pauses while asleep. She has had enuresis. I reviewed your office note from 12/23/2017. Her Epworth sleepiness score is 17 out of 24, fatigue score is 48 out of 63. She lives with her boyfriend and her daughter. She works at a [employer], technical sales. She smokes about 2 cigarettes daily, drinks alcohol occasionally, drinks caffeine in the form of soda, 3-4 cans per day on average. She drinks coffee in the morning, typically 1 cup. Rise time is around 6 AM and she has to take her daughter to school, etc. one hour drive, in the afternoon it gets to be very difficult for her to stay awake while driving. She  works part-time, typically from 9:30 AM to 1:30 PM on Mondays, Wednesdays, and Fridays, bedtime is around 10:30 or 11. She has nocturia about twice per average night, has had the occasional morning headache, and in the past 2 months or so she has had worsening snoring and witnessed apneas as well as enuresis a few times. She has gained weight in the past 2-3 years. She has been on Adderall long-acting for about 10 years and Effexor long-acting for about 10 years. She has with time reduced her Adderall dose. She has no obvious family history of obstructive sleep apnea but does not know much about her father's medical history. She has had occasional restless leg symptoms, more so during her pregnancies. She has a 63 year old daughter and a 35 year old daughter. She has had occasional sleep talking,  yelling out in her sleep and movements. She does not recall her dreams typically.   Her Past Medical History Is Significant For: Past Medical History:  Diagnosis Date  . Cellulitis and abscess of leg 11/15/2019  . Depression     Her Past Surgical History Is Significant For: No past surgical history on file.  Her Family History Is Significant For: Family History  Problem Relation Age of Onset  . Diabetes Other   . Hypertension Other   . Stroke Maternal Grandfather   . Healthy Daughter     Her Social History Is Significant For: Social History   Socioeconomic History  . Marital status: Single    Spouse name: Not on file  . Number of children: Not on file  . Years of education: Not on file  . Highest education level: Not on file  Occupational History  . Not on file  Tobacco Use  . Smoking status: Current Every Day Smoker    Types: Cigarettes  . Smokeless tobacco: Never Used  Vaping Use  . Vaping Use: Every day  . Devices: nicotene vape  Substance and Sexual Activity  . Alcohol use: Yes    Comment: Socially  . Drug use: No  . Sexual activity: Yes  Other Topics Concern  . Not on file   Social History Narrative  . Not on file   Social Determinants of Health   Financial Resource Strain: Not on file  Food Insecurity: Not on file  Transportation Needs: Not on file  Physical Activity: Not on file  Stress: Not on file  Social Connections: Not on file    Her Allergies Are:  No Known Allergies:   Her Current Medications Are:  Outpatient Encounter Medications as of 11/26/2020  Medication Sig  . amphetamine-dextroamphetamine (ADDERALL XR) 20 MG 24 hr capsule Take 1 capsule (20 mg total) by mouth daily. Ok to fill 30 days after sign  . amphetamine-dextroamphetamine (ADDERALL XR) 20 MG 24 hr capsule Take 1 capsule (20 mg total) by mouth daily. Ok to fill 60 days after sign  . levonorgestrel (MIRENA) 20 MCG/24HR IUD 1 each by Intrauterine route once.  . pramipexole (MIRAPEX) 0.125 MG tablet TAKE 2 TABLETS (0.25 MG TOTAL) BY MOUTH AT BEDTIME. TAKE 90-120 MINUTES BEFORE BEDTIME.  Marland Kitchen venlafaxine XR (EFFEXOR-XR) 150 MG 24 hr capsule TAKE 1 CAPSULE BY MOUTH EVERY DAY  . amphetamine-dextroamphetamine (ADDERALL XR) 20 MG 24 hr capsule Take 1 capsule (20 mg total) by mouth daily.   No facility-administered encounter medications on file as of 11/26/2020.  :  Review of Systems:  Out of a complete 14 point review of systems, all are reviewed and negative with the exception of these symptoms as listed below: Review of Systems  Neurological:       Pt presents today to discuss her bipap and Inspire procedure.    Objective:  Neurological Exam  Physical Exam Physical Examination:   Vitals:   11/26/20 1032  BP: 132/88  Pulse: 100    General Examination: The patient is a very pleasant 39 y.o. female in no acute distress. She appears well-developed and well-nourished and well groomed.   HEENT:Normocephalic, atraumatic, pupils are equal, round and reactive to light, extraocular tracking is good without limitation. Hearing is grossly intact. Face is symmetric with normal facial  animation. Speech is clear without dysarthria or hypophonia. There is no lip, neck/head, jaw or voice tremor. Neck is supple withFROM.Oropharynx exam reveals: mildmouth dryness, adequatedental hygiene and airway crowding.Tongue protrudes centrally and  palate elevates symmetrically.  Chest:Clear to auscultation without wheezing, rhonchi or crackles noted.  Heart:S1+S2+0, regular and normal without murmurs, rubs or gallops noted.   Abdomen:Soft, non-tender and non-distended with normal bowel sounds appreciated on auscultation.  Extremities:There isnoedema in the distal lower extremities bilaterally.  Skin: Warm and dry without trophic changes noted.  Musculoskeletal: exam reveals no obvious joint deformities, or joint swelling.   Neurologically:  Mental status: The patient is awake, alert and oriented in all 4 spheres.Herimmediate and remote memory, attention, language skills and fund of knowledge are appropriate. There is no evidence of aphasia, agnosia, apraxia or anomia. Speech is clear with normal prosody and enunciation. Thought process is linear. Mood is normaland affect is normal.  Cranial nerves II - XII are as described above under HEENT exam.  Motor exam: Normal bulk, strength and tone is noted. There is no tremor. Fine motor skills and coordination: grossly intact.  Cerebellar testing: No dysmetria or intention tremor. There is no truncal or gait ataxia.  Sensory exam: intact to light touchinthe upper and lower extremities.  Gait, station and balance:Shestands easily. No problems walking or turning are noted.    Assessmentand Plan:   In summary,Aida M Fieldsis a very pleasant 89 year oldfemalewith an underlying medical history of depression, ADD, recent smoking cessation and morbid obesity, whopresents for follow up consultation of herobstructive sleep apnea (OSA), on treatment with BiPAP of 18/14.  She has had very good compliance at times  and a little bit less compliance and other times, currently adequate with her BiPAP compliance.  She had a titration study in July 2019.  Her home sleep test in March 2019 indicated severe sleep apnea with an AHI of 76/h, O2 nadir less than 70%.  She had an abnormal pulse oximetry test in June 2019.  She was on AutoPap therapy at the time.   Based on her titration study we started BiPAP at 19/15 centimeters.  We then reduce the pressure to 18/14 centimeters.  She had consultation with ENT in the interim in November for inspire candidacy.  She continues to work on weight loss.  I suggested she work on weight loss for the next 6 more months and we will consider doing a home sleep test at the next appointment.  She is advised to try to use her BiPAP as best as possible and follow-up in 6 months to see Ward Givens, nurse practitioner. I answered all her questions today and she was in agreement. I spent 20 minutes in total face-to-face time and in reviewing records during pre-charting, more than 50% of which was spent in counseling and coordination of care, reviewing test results, reviewing medications and treatment regimen and/or in discussing or reviewing the diagnosis of OSA, the prognosis and treatment options. Pertinent laboratory and imaging test results that were available during this visit with the patient were reviewed by me and considered in my medical decision making (see chart for details).

## 2020-11-26 NOTE — Patient Instructions (Signed)
It was good to see you again today.  As discussed, you have been fairly consistent with your BiPAP usage.  Please continue to work on weight loss.  For inspire candidacy, your BMI should ideally be below 32.  Also, I think it would be a little premature to try to recheck your sleep apnea with a home sleep test at this point.  I would like for you to continue to use your BiPAP as best as possible and work on weight loss and follow-up to see Butch Penny, nurse practitioner in about 6 months. We will plan to do a home sleep test for reevaluation at the time.  We want your apnea/hypopnea score (AHI) to be below 65/h.

## 2020-12-21 ENCOUNTER — Other Ambulatory Visit: Payer: Self-pay | Admitting: Family Medicine

## 2020-12-21 DIAGNOSIS — F988 Other specified behavioral and emotional disorders with onset usually occurring in childhood and adolescence: Secondary | ICD-10-CM

## 2020-12-21 NOTE — Telephone Encounter (Signed)
Medication Refill - Medication: Adderall   Has the patient contacted their pharmacy? Yes.   Pt called stating that she could not receive this medication because she had no refills on file. Pt states that she is completely out of medication. Please advise. (Agent: If no, request that the patient contact the pharmacy for the refill.) (Agent: If yes, when and what did the pharmacy advise?)  Preferred Pharmacy (with phone number or street name):  [id number] [address] [address], [place] - [address]  [address] [place] [address]  Phone: [phone] Fax: [phone]  Hours: Not open 24 hours    Agent: Please be advised that RX refills may take up to 3 business days. We ask that you follow-up with your pharmacy.

## 2020-12-21 NOTE — Telephone Encounter (Signed)
Requested medication (s) are due for refill today: yes  Requested medication (s) are on the active medication list: yes  Last refill: 08/27/2020  Future visit scheduled: no  Notes to clinic:  this refill cannot be delegated    Requested Prescriptions  Pending Prescriptions Disp Refills   amphetamine-dextroamphetamine (ADDERALL XR) 20 MG 24 hr capsule 30 capsule 0    Sig: Take 1 capsule (20 mg total) by mouth daily.      There is no refill protocol information for this order

## 2020-12-21 NOTE — Telephone Encounter (Signed)
Please schedule pt for a follow up appointment for med refills with the NP prior to refills.   Thanks

## 2020-12-24 NOTE — Telephone Encounter (Signed)
Pt called and is needing refill on  amphetamine-dextroamphetamine (ADDERALL XR) 20 MG 24 hr capsule [[id number]]   Until appt on 1/26 with FNP Just Please advise.

## 2020-12-25 MED ORDER — AMPHETAMINE-DEXTROAMPHET ER 20 MG PO CP24: 20.0000 mg | ORAL_CAPSULE | Freq: Every day | ORAL | 0 refills | Status: DC

## 2020-12-25 NOTE — Telephone Encounter (Signed)
12/25/2020 - PATIENT REQUESTING A REFILL ON HER ADDERALL XR. KELSEA JUST HAS GIVEN HER A 30 DAY COURTESY REFILL. SHE HAS AN APPOINTMENT WITH KELSEA ON Wednesday 12/26/2020 AT 11:00 am THAT SHE NEEDS TO KEEP. I DO NOT HAVE TO ROUTE BACK TO THE NURSE'S STATION. MBC

## 2020-12-25 NOTE — Telephone Encounter (Signed)
Pt requesting curtesy of adderall until appt 12/26/2020? Or does pt just need to wait till appt?

## 2020-12-26 ENCOUNTER — Encounter: Payer: Self-pay | Admitting: Family Medicine

## 2020-12-26 ENCOUNTER — Ambulatory Visit (INDEPENDENT_AMBULATORY_CARE_PROVIDER_SITE_OTHER): Payer: BC Managed Care – PPO | Admitting: Family Medicine

## 2020-12-26 ENCOUNTER — Other Ambulatory Visit: Payer: Self-pay

## 2020-12-26 VITALS — BP 130/84 | HR 79 | Temp 98.0°F | Ht 61.0 in | Wt 192.0 lb

## 2020-12-26 DIAGNOSIS — M542 Cervicalgia: Secondary | ICD-10-CM

## 2020-12-26 DIAGNOSIS — Z6841 Body Mass Index (BMI) 40.0 and over, adult: Secondary | ICD-10-CM

## 2020-12-26 DIAGNOSIS — E782 Mixed hyperlipidemia: Secondary | ICD-10-CM

## 2020-12-26 DIAGNOSIS — F3342 Major depressive disorder, recurrent, in full remission: Secondary | ICD-10-CM

## 2020-12-26 DIAGNOSIS — R7303 Prediabetes: Secondary | ICD-10-CM | POA: Diagnosis not present

## 2020-12-26 DIAGNOSIS — F988 Other specified behavioral and emotional disorders with onset usually occurring in childhood and adolescence: Secondary | ICD-10-CM | POA: Diagnosis not present

## 2020-12-26 DIAGNOSIS — G2581 Restless legs syndrome: Secondary | ICD-10-CM

## 2020-12-26 MED ORDER — VENLAFAXINE HCL ER 75 MG PO CP24: 75.0000 mg | ORAL_CAPSULE | Freq: Every day | ORAL | 3 refills | Status: DC

## 2020-12-26 MED ORDER — AMPHETAMINE-DEXTROAMPHET ER 20 MG PO CP24: 20.0000 mg | ORAL_CAPSULE | Freq: Every day | ORAL | 0 refills | Status: DC

## 2020-12-26 MED ORDER — PRAMIPEXOLE DIHYDROCHLORIDE 0.125 MG PO TABS: 0.2500 mg | ORAL_TABLET | Freq: Every day | ORAL | 1 refills | Status: DC

## 2020-12-26 MED ORDER — VENLAFAXINE HCL ER 150 MG PO CP24: ORAL_CAPSULE | ORAL | 1 refills | Status: AC

## 2020-12-26 NOTE — Progress Notes (Signed)
1/26/202211:49 AM  Debbie White [birth year], 40 y.o., female [id number]  Chief Complaint  Patient presents with  . Medication Refill    Adderall 20 mg x 3 months  . L hand pain     Throbbing pain , pain to touch started yesterday     HPI:   Patient is a 40 y.o. female with past medical history significant for ADD, OSA on bipap, RLS and depression who presents today for routine followup  Woke up yesterday Left thumb sore Then hand was swollen and still sore Denies trauma Never happened in the past before Denies numbness, tingling, loss of sensation Taking Ibuprofen for this, Iced it last night Noticed she has a knot on the back of her neck   She is overall doing well She reports that her ADD is doing ok on adderrall XR 20mg  daily She however has noticed that around 3pm effects starting to wear off as she has more work Concerned that an increased or second dose would keep her up at night She does not want to adjust at this time as later in the day dosing tends to affect with her sleep denies any side effects (mood changes, insomnia, palpitations, decreased appetite, chest pain, high BP) pmp reviewed UDS done march 2021  She reports that her depression is well controlled on effexor XR 150mg  daily, PHQ9 noted Some days better than others Occasionally was taking 2 capsules  Has been working on weight loss Trying to get off CPAP Would like to use inspire    HLD Lab Results  Component Value Date   CHOL 187 10/29/2020   HDL 36 (L) 10/29/2020   LDLCALC 135 (H) 10/29/2020   TRIG 85 10/29/2020   CHOLHDL 5.2 (H) 10/29/2020   The 10-year ASCVD risk score 10/31/2020 DC Jr., et al., 2013) is: 4.8%   Values used to calculate the score:     Age: 29 years     Sex: Female     Is Non-Hispanic African American: No     Diabetic: No     Tobacco smoker: Yes     Systolic Blood Pressure: 130 mmHg     Is BP treated: No     HDL Cholesterol: 36 mg/dL     Total Cholesterol: 187  mg/dL  Lab Results  Component Value Date   HGBA1C 5.8 (A) 11/09/2019    Depression screen [facility] 2/9 12/26/2020 08/27/2020 02/09/2020  Decreased Interest 0 0 0  Down, Depressed, Hopeless 0 0 0  PHQ - 2 Score 0 0 0  Altered sleeping - 0 1  Tired, decreased energy - 0 1  Change in appetite - 0 1  Feeling bad or failure about yourself  - 0 1  Trouble concentrating - 0 1  Moving slowly or fidgety/restless - 0 0  Suicidal thoughts - 0 0  PHQ-9 Score - 0 5  Difficult doing work/chores - Not difficult at all Not difficult at all    Fall Risk  12/26/2020 08/27/2020 02/09/2020 11/11/2019 11/09/2019  Falls in the past year? 0 0 0 0 0  Number falls in past yr: 0 0 0 0 0  Injury with Fall? 0 0 0 0 0  Follow up Falls evaluation completed Falls evaluation completed - - Falls evaluation completed     No Known Allergies  Prior to Admission medications   Medication Sig Start Date End Date Taking? Authorizing Provider  amphetamine-dextroamphetamine (ADDERALL XR) 20 MG 24 hr capsule Take 1 capsule (20 mg total)  by mouth daily. Ok to fill 30 days after sign 08/27/20  Yes Lezlie Lye, Meda Coffee, MD  amphetamine-dextroamphetamine (ADDERALL XR) 20 MG 24 hr capsule Take 1 capsule (20 mg total) by mouth daily. Ok to fill 60 days after sign 08/27/20  Yes Lezlie Lye, Meda Coffee, MD  amphetamine-dextroamphetamine (ADDERALL XR) 20 MG 24 hr capsule Take 1 capsule (20 mg total) by mouth daily. 12/25/20 01/24/21 Yes Didi Ganaway, Azalee Course, FNP  levonorgestrel (MIRENA) 20 MCG/24HR IUD 1 each by Intrauterine route once.   Yes [provider]  pramipexole (MIRAPEX) 0.125 MG tablet TAKE 2 TABLETS (0.25 MG TOTAL) BY MOUTH AT BEDTIME. TAKE 90-120 MINUTES BEFORE BEDTIME. 10/01/20  Yes Huston Foley, MD  venlafaxine XR (EFFEXOR-XR) 150 MG 24 hr capsule TAKE 1 CAPSULE BY MOUTH EVERY DAY 08/27/20  Yes Lezlie Lye, Meda Coffee, MD    Past Medical History:  Diagnosis Date  . Cellulitis and abscess of leg 11/15/2019  . Depression      History reviewed. No pertinent surgical history.  Social History   Tobacco Use  . Smoking status: Current Every Day Smoker    Types: Cigarettes  . Smokeless tobacco: Never Used  Substance Use Topics  . Alcohol use: Yes    Comment: Socially    Family History  Problem Relation Age of Onset  . Diabetes Other   . Hypertension Other   . Stroke Maternal Grandfather   . Healthy Daughter     Review of Systems  Constitutional: Negative for chills, fever and malaise/fatigue.  Eyes: Negative for blurred vision and double vision.  Respiratory: Negative for cough, shortness of breath and wheezing.   Cardiovascular: Negative for chest pain, palpitations and leg swelling.  Gastrointestinal: Negative for abdominal pain, blood in stool, constipation, diarrhea, heartburn, nausea and vomiting.  Genitourinary: Negative for dysuria, frequency and hematuria.  Musculoskeletal: Positive for neck pain. Negative for back pain and joint pain.       Left hand pain  Skin: Negative for rash.  Neurological: Negative for dizziness, weakness and headaches.     OBJECTIVE:  Today's Vitals   12/26/20 1105  BP: 130/84  Pulse: 79  Temp: 98 F (36.7 C)  SpO2: 98%  Weight: 192 lb (87.1 kg)  Height: 5\' 1"  (1.549 m)   Body mass index is 36.28 kg/m.   Physical Exam Constitutional:      General: She is not in acute distress.    Appearance: Normal appearance. She is not ill-appearing.  HENT:     Head: Normocephalic.  Neck:     Comments: Knot on neck felt Cardiovascular:     Rate and Rhythm: Normal rate and regular rhythm.     Pulses: Normal pulses.     Heart sounds: Normal heart sounds. No murmur heard. No friction rub. No gallop.   Pulmonary:     Effort: Pulmonary effort is normal. No respiratory distress.     Breath sounds: Normal breath sounds. No stridor. No wheezing, rhonchi or rales.  Abdominal:     General: Bowel sounds are normal.     Palpations: Abdomen is soft.      Tenderness: There is no abdominal tenderness.  Musculoskeletal:     Left shoulder: Normal.     Right hand: Normal.     Left hand: Normal.     Cervical back: Normal range of motion. No rigidity or tenderness.     Right lower leg: No edema.     Left lower leg: No edema.  Lymphadenopathy:  Cervical: No cervical adenopathy.  Skin:    General: Skin is warm and dry.  Neurological:     Mental Status: She is alert and oriented to person, place, and time.  Psychiatric:        Mood and Affect: Mood normal.        Behavior: Behavior normal.     No results found for this or any previous visit (from the past 24 hour(s)).  No results found.   ASSESSMENT and PLAN  Problem List Items Addressed This Visit      Other   Class 3 severe obesity with body mass index (BMI) of 40.0 to 44.9 in adult [facility])   Relevant Medications   amphetamine-dextroamphetamine (ADDERALL XR) 20 MG 24 hr capsule   Attention deficit disorder (ADD) without hyperactivity - Primary   Relevant Medications   amphetamine-dextroamphetamine (ADDERALL XR) 20 MG 24 hr capsule   Other Relevant Orders   ToxASSURE Select 13 (MW), Urine   RLS (restless legs syndrome)   Relevant Medications   pramipexole (MIRAPEX) 0.125 MG tablet   Recurrent major depressive disorder, in full remission (HCC)   Relevant Medications   venlafaxine XR (EFFEXOR XR) 75 MG 24 hr capsule   venlafaxine XR (EFFEXOR-XR) 150 MG 24 hr capsule    Other Visit Diagnoses    Cervicalgia       Relevant Orders   Ambulatory referral to Physical Therapy   Prediabetes       Relevant Orders   Hemoglobin A1c   Mixed hyperlipidemia          Plan . Declined adderall increase at this time.Refills sent. PDMP reviewed. Toxassure today.  . Will follow up with A1c . Discussed HLD and ASCVD risk. Declined medications and referral to nutritionist and MWM. Will continue to work on LFM at home. Will start Omega 3 fish oil. . Increased Effexor from 150 to 225 today.  Will follow up. Marland Kitchen Referral to PT and continue Ibuprofen for cervicalgia. Encouraged alt medicine treatments such as massage and chiropractor.   Return in about 6 months (around 06/25/2021).    Macario Carls Yonah Tangeman, FNP-BC Primary Care at [facility] [address] [place], [place] [address] Ph.  [phone] Fax [phone]

## 2020-12-26 NOTE — Patient Instructions (Addendum)
Cervical Strain and Sprain Rehab Ask your health care provider which exercises are safe for you. Do exercises exactly as told by your health care provider and adjust them as directed. It is normal to feel mild stretching, pulling, tightness, or discomfort as you do these exercises. Stop right away if you feel sudden pain or your pain gets worse. Do not begin these exercises until told by your health care provider. Stretching and range-of-motion exercises Cervical side bending 1. Using good posture, sit on a stable chair or stand up. 2. Without moving your shoulders, slowly tilt your left / right ear to your shoulder until you feel a stretch in the opposite side neck muscles. You should be looking straight ahead. 3. Hold for __________ seconds. 4. Repeat with the other side of your neck. Repeat __________ times. Complete this exercise __________ times a day.   Cervical rotation 1. Using good posture, sit on a stable chair or stand up. 2. Slowly turn your head to the side as if you are looking over your left / right shoulder. ? Keep your eyes level with the ground. ? Stop when you feel a stretch along the side and the back of your neck. 3. Hold for __________ seconds. 4. Repeat this by turning to your other side. Repeat __________ times. Complete this exercise __________ times a day.   Thoracic extension and pectoral stretch 1. Roll a towel or a small blanket so it is about 4 inches (10 cm) in diameter. 2. Lie down on your back on a firm surface. 3. Put the towel lengthwise, under your spine in the middle of your back. It should not be under your shoulder blades. The towel should line up with your spine from your middle back to your lower back. 4. Put your hands behind your head and let your elbows fall out to your sides. 5. Hold for __________ seconds. Repeat __________ times. Complete this exercise __________ times a day. Strengthening exercises Isometric upper cervical flexion 1. Lie  on your back with a thin pillow behind your head and a small rolled-up towel under your neck. 2. Gently tuck your chin toward your chest and nod your head down to look toward your feet. Do not lift your head off the pillow. 3. Hold for __________ seconds. 4. Release the tension slowly. Relax your neck muscles completely before you repeat this exercise. Repeat __________ times. Complete this exercise __________ times a day. Isometric cervical extension 1. Stand about 6 inches (15 cm) away from a wall, with your back facing the wall. 2. Place a soft object, about 6-8 inches (15-20 cm) in diameter, between the back of your head and the wall. A soft object could be a small pillow, a ball, or a folded towel. 3. Gently tilt your head back and press into the soft object. Keep your jaw and forehead relaxed. 4. Hold for __________ seconds. 5. Release the tension slowly. Relax your neck muscles completely before you repeat this exercise. Repeat __________ times. Complete this exercise __________ times a day.   Posture and body mechanics Body mechanics refers to the movements and positions of your body while you do your daily activities. Posture is part of body mechanics. Good posture and healthy body mechanics can help to relieve stress in your body's tissues and joints. Good posture means that your spine is in its natural S-curve position (your spine is neutral), your shoulders are pulled back slightly, and your head is not tipped forward. The following are  general guidelines for applying improved posture and body mechanics to your everyday activities. Sitting 1. When sitting, keep your spine neutral and keep your feet flat on the floor. Use a footrest, if necessary, and keep your thighs parallel to the floor. Avoid rounding your shoulders, and avoid tilting your head forward. 2. When working at a desk or a computer, keep your desk at a height where your hands are slightly lower than your elbows. Slide your  chair under your desk so you are close enough to maintain good posture. 3. When working at a computer, place your monitor at a height where you are looking straight ahead and you do not have to tilt your head forward or downward to look at the screen.   Standing  When standing, keep your spine neutral and keep your feet about hip-width apart. Keep a slight bend in your knees. Your ears, shoulders, and hips should line up.  When you do a task in which you stand in one place for a long time, place one foot up on a stable object that is 2-4 inches (5-10 cm) high, such as a footstool. This helps keep your spine neutral.   Resting When lying down and resting, avoid positions that are most painful for you. Try to support your neck in a neutral position. You can use a contour pillow or a small rolled-up towel. Your pillow should support your neck but not push on it. This information is not intended to replace advice given to you by your health care provider. Make sure you discuss any questions you have with your health care provider. Document Revised: 03/09/2019 Document Reviewed: 08/18/2018 Elsevier Patient Education  2021 [employer].   If you have lab work done today you will be contacted with your lab results within the next 2 weeks.  If you have not heard from [place] then please contact us. The fastest way to get your results is to register for My Chart.   IF you received an x-ray today, you will receive an invoice from [facility]. Please contact [facility] at [phone] with questions or concerns regarding your invoice.   IF you received labwork today, you will receive an invoice from [facility]. Please contact [facility] at [phone] with questions or concerns regarding your invoice.   Our billing staff will not be able to assist you with questions regarding bills from these companies.  You will be contacted with the lab results as soon as they are available. The fastest way  to get your results is to activate your My Chart account. Instructions are located on the last page of this paperwork. If you have not heard from [place] regarding the results in 2 weeks, please contact this office.

## 2020-12-27 LAB — HEMOGLOBIN A1C
Est. average glucose Bld gHb Est-mCnc: 120 mg/dL
Hgb A1c MFr Bld: 5.8 % — ABNORMAL HIGH (ref 4.8–5.6)

## 2021-01-01 LAB — TOXASSURE SELECT 13 (MW), URINE

## 2021-01-02 NOTE — Progress Notes (Signed)
No Adderall was seen in Urine on 12/26/20.

## 2021-01-07 ENCOUNTER — Encounter: Payer: Self-pay | Admitting: Family Medicine

## 2021-01-07 NOTE — Telephone Encounter (Signed)
Pt reports toxxasure was absent of adderall due to her having been out for 5 days but states she is willing to come for repeat if you wish

## 2021-01-11 ENCOUNTER — Other Ambulatory Visit: Payer: Self-pay

## 2021-01-11 ENCOUNTER — Ambulatory Visit: Payer: BC Managed Care – PPO | Attending: Family Medicine

## 2021-01-11 DIAGNOSIS — M5412 Radiculopathy, cervical region: Secondary | ICD-10-CM | POA: Diagnosis not present

## 2021-01-11 DIAGNOSIS — M62838 Other muscle spasm: Secondary | ICD-10-CM

## 2021-01-11 DIAGNOSIS — M79602 Pain in left arm: Secondary | ICD-10-CM | POA: Diagnosis not present

## 2021-01-11 DIAGNOSIS — M6281 Muscle weakness (generalized): Secondary | ICD-10-CM

## 2021-01-11 DIAGNOSIS — R293 Abnormal posture: Secondary | ICD-10-CM

## 2021-01-11 DIAGNOSIS — M542 Cervicalgia: Secondary | ICD-10-CM | POA: Diagnosis not present

## 2021-01-11 NOTE — Therapy (Signed)
[facility] [address] [place], [place], [address] Phone: [phone]   Fax:  [phone]  Physical Therapy Evaluation  Patient Details  Name: Debbie White MRN: [id number] Date of Birth: March 07, 1981 Referring Provider (PT): Macario Carls Just NP, at primary care office.   Encounter Date: 01/11/2021   PT End of Session - 01/11/21 1150    Visit Number 1    Number of Visits 9    Date for PT Re-Evaluation 03/08/21    Authorization Type BCBS    PT Start Time 0845    PT Stop Time 0930    PT Time Calculation (min) 45 min    Activity Tolerance Patient tolerated treatment well    Behavior During Therapy WFL for tasks assessed/performed           Past Medical History:  Diagnosis Date  . Cellulitis and abscess of leg 11/15/2019  . Depression     History reviewed. No pertinent surgical history.  There were no vitals filed for this visit.    Subjective Assessment - 01/11/21 0848    Subjective Pt reports left sided neck pain with "knot" noticed about 3 months ago, was not bothering her until she began to get pain into arm. One day thumb begna throbbing with shooting pains into arm. No injurious event recalled. Pt with history of thoracic/mid back pain on and off.    Pertinent History OSA  with BiPAP, ADD    Limitations Sitting    How long can you sit comfortably? 10-30 minutes, start to get pain and knot in neck    Patient Stated Goals Decrease pain, improve strength/function    Currently in Pain? Yes    Pain Score 0-No pain    Pain Location Neck    Pain Orientation Left    Pain Descriptors / Indicators Aching;Spasm;Tightness    Pain Type Chronic pain    Pain Radiating Towards thumb/fingers with throbbing and N/T when exacrebated.    Pain Onset More than a month ago    Pain Frequency Intermittent    Aggravating Factors  Prolonged sitting    Effect of Pain on Daily Activities Pain during work, decreased strength in arm and  hand, difficulty with ADLs and hygeiene activities when she gets onset ofpain and N/T into hand              [facility] PT Assessment - 01/11/21 0853      Assessment   Medical Diagnosis Cervicalgia    Referring Provider (PT) Adelina Mings Just NP, at primary care office.    Hand Dominance Right    Prior Therapy No   History of "crushing" middle 2 finger on L hadn and saw ortho and PT for that in 2019. Has not seen ortho/PT for this condition.     Balance Screen   Has the patient fallen in the past 6 months No    Has the patient had a decrease in activity level because of a fear of falling?  No    Is the patient reluctant to leave their home because of a fear of falling?  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    Leisure Watching TV, shopping, taking care of grandson      Cognition   Overall Cognitive Status Within Functional Limits for tasks  assessed      Posture/Postural Control   Posture/Postural Control Postural limitations    Postural Limitations Rounded Shoulders;Forward head;Posterior pelvic tilt;Increased thoracic kyphosis      ROM / Strength   AROM / PROM / Strength Strength;AROM      AROM   AROM Assessment Site Cervical    Cervical Flexion 60    Cervical Extension 30    Cervical - Right Side Bend 45    Cervical - Left Side Bend 45   + discomfort L neck   Cervical - Right Rotation 70    Cervical - Left Rotation 70      Strength   Strength Assessment Site Shoulder;Elbow;Forearm;Wrist;Hand;Cervical    Right/Left Shoulder Right;Left    Right Shoulder Flexion 4+/5    Right Shoulder ABduction 4/5    Right Shoulder External Rotation 4+/5    Left Shoulder Flexion 4+/5    Left Shoulder ABduction 4/5    Left Shoulder External Rotation 4+/5    Right/Left Elbow Right;Left    Right Elbow Flexion 4+/5     Right Elbow Extension 4+/5    Left Elbow Flexion 4/5    Left Elbow Extension 4/5    Right/Left Forearm Right;Left   WFL B   Right/Left Wrist Right;Left    Right Wrist Flexion 4/5    Right Wrist Extension 4+/5    Left Wrist Flexion 4/5    Left Wrist Extension 4-/5    Right/Left hand Right;Left    Right Hand Grip (lbs) 75#    Left Hand Grip (lbs) 60#      Flexibility   Soft Tissue Assessment /Muscle Length --   Tightness B pec +/-     Palpation   Palpation comment TTP lower cervical spine B paraspinals, upper traps, suboccipitals      Special Tests    Special Tests Cervical    Other special tests ULTTA  median bias + for N/T    Cervical Tests --      Spurling's   Findings Positive    Side Left                      Objective measurements completed on examination: See above findings.       [facility] Adult PT Treatment/Exercise - 01/11/21 0853      Exercises   Exercises Neck      Neck Exercises: Seated   Other Seated Exercise Scap retractions x 5      Neck Exercises: Supine   Other Supine Exercise Cervical retraction x5, Median nerve floss x 10 LUE    Other Supine Exercise Manual stretch cervical flexion,lateral flexion                  PT Education - 01/11/21 1147    Education Details PT POC, Initial HEP, Office/desk posture    Person(s) Educated Patient    Methods Explanation;Demonstration;Handout    Comprehension Verbalized understanding;Returned demonstration            PT Short Term Goals - 01/11/21 1200      PT SHORT TERM GOAL #1   Title independent with initial HEP    Time 2    Period Weeks    Status New    Target Date 01/25/21             PT Long Term Goals - 01/11/21 1200      PT LONG TERM GOAL #1   Title Pt will demo independence with advanced HEP  Time 8    Period Weeks    Status New    Target Date 03/08/21      PT LONG TERM GOAL #2   Title Pt will demo improved grip strength on left to be witin at least 5# of  right for functional grip with ADLs    Time 8    Period Weeks    Status New    Target Date 03/08/21      PT LONG TERM GOAL #3   Title Pt will report decreased neck pain to </= 1/10 with little to no radiculopathy into LUE    Time 8    Period Weeks    Status New    Target Date 03/08/21      PT LONG TERM GOAL #4   Title Pt will report decrease in symptoms during work day    Period Weeks    Status New    Target Date 03/08/21                  Plan - 01/11/21 1151    Clinical Impression Statement Pt is a 40 yo female who presents with neck pain in the lower cervical spine with pain radiating into left arm with numbness and tingling. She reported left sided neck pain with "knot" noticed about 3 months ago, was not bothering her until she began to get pain into arm. Pt with history of thoracic/mid back pain on and off as well. She also reports headaches at the back of head.  Pt presents with abnormal posture, painful ROM, muscle weakness, positive upper limb tension testing with median nerve bias on the left, decreased LEFT grip strength, and TTP along cervical spine. She did not have pain at start or end of eval today but symptoms were reproduced with ULTT. She will benefit from skilled physical therapy to work on postural reeducation, cervical spine mobility and strength, improving neurodynamics, decreasing muscle pain and spasms, and improving overlal function.    Personal Factors and Comorbidities Profession;Comorbidity 2;Time since onset of injury/illness/exacerbation    Examination-Activity Limitations Bathing;Reach Overhead;Caring for Others;Carry;Lift;Hygiene/Grooming;Dressing    Examination-Participation Restrictions Cleaning;Community Activity;Occupation;Driving;Interpersonal Relationship    Stability/Clinical Decision Making Stable/Uncomplicated    Clinical Decision Making Low    Rehab Potential Good    PT Frequency 1x / week    PT Duration 8 weeks    PT  Treatment/Interventions ADLs/Self Care Home Management;Cryotherapy;Electrical Stimulation;Iontophoresis 4mg /ml Dexamethasone;Moist Heat;Neuromuscular re-education;Therapeutic exercise;Therapeutic activities;Functional mobility training;Patient/family education;Manual techniques;Joint Manipulations;Energy conservation;Spinal Manipulations;Passive range of motion;Taping    PT Next Visit Plan reassess HEP. Progress cervical ROM/strength, postural strengthening as tolerated.    Consulted and Agree with Plan of Care Patient           Patient will benefit from skilled therapeutic intervention in order to improve the following deficits and impairments:  Decreased range of motion,Decreased endurance,Increased muscle spasms,Impaired UE functional use,Pain,Impaired flexibility,Improper body mechanics,Postural dysfunction,Decreased strength,Decreased mobility  Visit Diagnosis: Abnormal posture - Plan: PT plan of care cert/re-cert  Muscle weakness (generalized) - Plan: PT plan of care cert/re-cert  Cervicalgia - Plan: PT plan of care cert/re-cert  Radiculopathy, cervical region - Plan: PT plan of care cert/re-cert  Pain in left arm - Plan: PT plan of care cert/re-cert  Other muscle spasm - Plan: PT plan of care cert/re-cert     Problem List Patient Active Problem List   Diagnosis Date Noted  . Recurrent major depressive disorder, in full remission (HCC) 08/27/2020  . Cellulitis and abscess of leg 11/15/2019  .  RLS (restless legs syndrome) 08/12/2019  . Fracture of middle phalanx of finger 04/30/2018  . Closed fracture of distal phalanx of finger 04/30/2018  . Class 3 severe obesity with body mass index (BMI) of 40.0 to 44.9 in adult [facility]) 03/23/2018  . OSA treated with BiPAP 03/23/2018  . Tachycardia 03/23/2018  . Episode of recurrent major depressive disorder (HCC) 03/23/2018  . Attention deficit disorder (ADD) without hyperactivity 03/23/2018  . Presence of of 52 mg  levonorgestrel-releasing intrauterine device (IUD) 02/22/2016    Anson Crofts, PT, DPT 01/11/2021, 12:09 PM  [facility] [address] [place], [place], [address] Phone: [phone]   Fax:  [phone]  Name: Debbie White MRN: [id number] Date of Birth: [birth year]

## 2021-01-11 NOTE — Patient Instructions (Signed)
Access Code: [email or web address] URL: [email or web address] Date: 01/11/2021 Prepared by: Claude Manges, PT, DPT  Exercises Supine Cervical Retraction with Towel - 1 x daily - 5 x weekly - 2 sets - 10 reps - 5 seconds hold Seated Scapular Retraction - 1 x daily - 5 x weekly - 2 sets - 10 reps - 5 second hold Median Nerve Flossing - Tray - 1 x daily - 5 x weekly - 1 sets - 10 reps - 5 second hold  Patient Education Office Posture

## 2021-01-18 ENCOUNTER — Ambulatory Visit: Payer: BC Managed Care – PPO | Admitting: Physical Therapy

## 2021-01-18 ENCOUNTER — Other Ambulatory Visit: Payer: Self-pay

## 2021-01-18 DIAGNOSIS — M6281 Muscle weakness (generalized): Secondary | ICD-10-CM

## 2021-01-18 DIAGNOSIS — R293 Abnormal posture: Secondary | ICD-10-CM

## 2021-01-18 DIAGNOSIS — M5412 Radiculopathy, cervical region: Secondary | ICD-10-CM | POA: Diagnosis not present

## 2021-01-18 DIAGNOSIS — M542 Cervicalgia: Secondary | ICD-10-CM

## 2021-01-18 DIAGNOSIS — M62838 Other muscle spasm: Secondary | ICD-10-CM | POA: Diagnosis not present

## 2021-01-18 DIAGNOSIS — M79602 Pain in left arm: Secondary | ICD-10-CM | POA: Diagnosis not present

## 2021-01-18 NOTE — Patient Instructions (Signed)

## 2021-01-18 NOTE — Therapy (Signed)
[place] [place], [place], [address] Phone: [phone]   Fax:  [phone]  Physical Therapy Treatment  Patient Details  Name: Debbie White MRN: [id number] Date of Birth: 1981-06-30 Referring Provider (PT): Merleen Nicely Just NP, at primary care office.   Encounter Date: 01/18/2021   PT End of Session - 01/18/21 0954    Visit Number 2    Number of Visits 9    Date for PT Re-Evaluation 03/08/21    PT Start Time 0925    PT Stop Time 1010    PT Time Calculation (min) 45 min           Past Medical History:  Diagnosis Date  . Cellulitis and abscess of leg 11/15/2019  . Depression     No past surgical history on file.  There were no vitals filed for this visit.   Subjective Assessment - 01/18/21 0928    Subjective ex are helping, radiating pains are coming and going. fixed my desk at work    Currently in Pain? No/denies                             [facility] Adult PT Treatment/Exercise - 01/18/21 0001      Neck Exercises: Machines for Strengthening   UBE (Upper Arm Bike) L 2 2 min fwd/2 min back      Neck Exercises: Theraband   Scapula Retraction 20 reps;Red    Shoulder Extension 20 reps;Red    Shoulder External Rotation 20 reps;Red    Horizontal ABduction 20 reps;Red      Neck Exercises: Standing   Neck Retraction 15 reps;3 secs   with ball   Other Standing Exercises ball vs wall 5 x CC and CCW      Manual Therapy   Manual Therapy Soft tissue mobilization    Manual therapy comments multi TP    Soft tissue mobilization BIL trap and rhom            Trigger Point Dry Needling - 01/18/21 0001    Consent Given? Yes    Education Handout Provided Yes    Muscles Treated Head and Neck Upper trapezius;Levator scapulae    Upper Trapezius Response Twitch reponse elicited;Palpable increased muscle length    Levator Scapulae Response Twitch response elicited;Palpable increased muscle  length                PT Education - 01/18/21 0952    Education Details scap stab 4 way with tband, cerv retraction with resistance using car headrest. postural educ    Person(s) Educated Patient    Methods Explanation;Demonstration;Handout    Comprehension Verbalized understanding;Returned demonstration            PT Short Term Goals - 01/18/21 0954      PT SHORT TERM GOAL #1   Title independent with initial HEP    Status Achieved             PT Long Term Goals - 01/11/21 1200      PT LONG TERM GOAL #1   Title Pt will demo independence with advanced HEP    Time 8    Period Weeks    Status New    Target Date 03/08/21      PT LONG TERM GOAL #2   Title Pt will demo improved grip strength on left to be witin at least 5# of right for functional  grip with ADLs    Time 8    Period Weeks    Status New    Target Date 03/08/21      PT LONG TERM GOAL #3   Title Pt will report decreased neck pain to </= 1/10 with little to no radiculopathy into LUE    Time 8    Period Weeks    Status New    Target Date 03/08/21      PT LONG TERM GOAL #4   Title Pt will report decrease in symptoms during work day    Period Weeks    Status New    Target Date 03/08/21                 Plan - 01/18/21 0954    Clinical Impression Statement STG met. increased HEP with postural strengtheing ex- cerv cuing needed. postural educ givine throughout session and when radiating symptoms occured cerv retaction decreased symptoms. pt with multi TP in traps and rhom so DN performed followed by [facility]    PT Treatment/Interventions ADLs/Self Care Home Management;Cryotherapy;Electrical Stimulation;Iontophoresis 36m/ml Dexamethasone;Moist Heat;Neuromuscular re-education;Therapeutic exercise;Therapeutic activities;Functional mobility training;Patient/family education;Manual techniques;Joint Manipulations;Energy conservation;Spinal Manipulations;Passive range of motion;Taping    PT Next Visit Plan  assess and progress           Patient will benefit from skilled therapeutic intervention in order to improve the following deficits and impairments:  Decreased range of motion,Decreased endurance,Increased muscle spasms,Impaired UE functional use,Pain,Impaired flexibility,Improper body mechanics,Postural dysfunction,Decreased strength,Decreased mobility  Visit Diagnosis: Abnormal posture  Muscle weakness (generalized)  Cervicalgia  Radiculopathy, cervical region     Problem List Patient Active Problem List   Diagnosis Date Noted  . Recurrent major depressive disorder, in full remission ([facility] 08/27/2020  . Cellulitis and abscess of leg 11/15/2019  . RLS (restless legs syndrome) 08/12/2019  . Fracture of middle phalanx of finger 04/30/2018  . Closed fracture of distal phalanx of finger 04/30/2018  . Class 3 severe obesity with body mass index (BMI) of 40.0 to 44.9 in adult ([facility] 03/23/2018  . OSA treated with BiPAP 03/23/2018  . Tachycardia 03/23/2018  . Episode of recurrent major depressive disorder ([facility] 03/23/2018  . Attention deficit disorder (ADD) without hyperactivity 03/23/2018  . Presence of of 52 mg levonorgestrel-releasing intrauterine device (IUD) 02/22/2016    Rayla Pember,ANGIE PTA 01/18/2021, 10:00 AM  [place] [address]Phone: [phone]  Fax:  [phone] Name: Debbie KARGERMRN: [id number] of Birth: [birth year]

## 2021-01-25 ENCOUNTER — Other Ambulatory Visit: Payer: Self-pay

## 2021-01-25 ENCOUNTER — Ambulatory Visit: Payer: BC Managed Care – PPO

## 2021-01-25 DIAGNOSIS — R293 Abnormal posture: Secondary | ICD-10-CM

## 2021-01-25 DIAGNOSIS — M5412 Radiculopathy, cervical region: Secondary | ICD-10-CM

## 2021-01-25 DIAGNOSIS — M79602 Pain in left arm: Secondary | ICD-10-CM | POA: Diagnosis not present

## 2021-01-25 DIAGNOSIS — M542 Cervicalgia: Secondary | ICD-10-CM | POA: Diagnosis not present

## 2021-01-25 DIAGNOSIS — M62838 Other muscle spasm: Secondary | ICD-10-CM | POA: Diagnosis not present

## 2021-01-25 DIAGNOSIS — M6281 Muscle weakness (generalized): Secondary | ICD-10-CM

## 2021-01-25 NOTE — Therapy (Signed)
[facility] [address] [place], [place], [address] Phone: [phone]   Fax:  [phone]  Physical Therapy Treatment  Patient Details  Name: Debbie White MRN: [id number] Date of Birth: 1981-01-11 Referring Provider (PT): Adelina Mings Just NP, at primary care office.   Encounter Date: 01/25/2021   PT End of Session - 01/25/21 0935    Visit Number 3    Number of Visits 9    Date for PT Re-Evaluation 03/08/21           Past Medical History:  Diagnosis Date  . Cellulitis and abscess of leg 11/15/2019  . Depression     History reviewed. No pertinent surgical history.  There were no vitals filed for this visit.   Subjective Assessment - 01/25/21 0934    Subjective Feels exercises are helping, changed set up at work and has helped. Hasnt had much nec pain but still numbness into hand, worse when arm is held up. Only did progressed band exercises at home a few times since last visit. needs to leave session earlier today, has to care for grandchild    Pertinent History OSA  with BiPAP, ADD    Limitations Sitting    How long can you sit comfortably? 10-30 minutes, start to get pain and knot in neck    Patient Stated Goals Decrease pain, improve strength/function    Currently in Pain? No/denies                   [facility] Adult PT Treatment/Exercise - 01/25/21 0001      Neck Exercises: Machines for Strengthening   UBE (Upper Arm Bike) L 2,  3 min fwd/3 min back      Neck Exercises: Theraband   Scapula Retraction 20 reps;Red    Shoulder Extension 20 reps;Red    Shoulder External Rotation 20 reps;Red    Horizontal ABduction 20 reps;Red      Neck Exercises: Standing   Neck Retraction 10 reps;3 secs   with ball on wall   Neck Retraction Limitations cervical retraction with UE diagonals, red TB x10 B    Other Standing Exercises ball vs wall 5 x CC and CCW      Neck Exercises: Stretches   Upper Trapezius Stretch 2  reps;30 seconds;Left;Right   arm hanging down or hooked on bootom of seat   Levator Stretch Right;Left;2 reps;30 seconds                  PT Education - 01/25/21 1004    Education Details Updated HEP reinforced with emphasis on carryover/consistency. Educated pt in use of UT/LS stretching as well.    Person(s) Educated Patient    Methods Explanation;Demonstration    Comprehension Verbalized understanding;Returned demonstration            PT Short Term Goals - 01/18/21 0954      PT SHORT TERM GOAL #1   Title independent with initial HEP    Status Achieved             PT Long Term Goals - 01/11/21 1200      PT LONG TERM GOAL #1   Title Pt will demo independence with advanced HEP    Time 8    Period Weeks    Status New    Target Date 03/08/21      PT LONG TERM GOAL #2   Title Pt will demo improved grip strength on left to be witin at  least 5# of right for functional grip with ADLs    Time 8    Period Weeks    Status New    Target Date 03/08/21      PT LONG TERM GOAL #3   Title Pt will report decreased neck pain to </= 1/10 with little to no radiculopathy into LUE    Time 8    Period Weeks    Status New    Target Date 03/08/21      PT LONG TERM GOAL #4   Title Pt will report decrease in symptoms during work day    Period Weeks    Status New    Target Date 03/08/21                 Plan - 01/25/21 0936    Clinical Impression Statement Pt reported feeling really good after DN last visit. Continued to reinforce postural ed throughout session. Focus of session today on proressed exercises with red TB. Ended session with neck retractions and UT/LS stretching with excellent tolerance. reported numbness in hand was completely gone after neck retraction exercises and neck stretching end of session. Session concluded at 30 minutes today er pt request, as she neded to leave to take care of grandchild. Continue to progress as tolerated.    Personal Factors  and Comorbidities Profession;Comorbidity 2;Time since onset of injury/illness/exacerbation    Examination-Activity Limitations Bathing;Reach Overhead;Caring for Others;Carry;Lift;Hygiene/Grooming;Dressing    Examination-Participation Restrictions Cleaning;Community Activity;Occupation;Driving;Interpersonal Relationship    Rehab Potential Good    PT Frequency 1x / week    PT Duration 8 weeks    PT Treatment/Interventions ADLs/Self Care Home Management;Cryotherapy;Electrical Stimulation;Iontophoresis 4mg /ml Dexamethasone;Moist Heat;Neuromuscular re-education;Therapeutic exercise;Therapeutic activities;Functional mobility training;Patient/family education;Manual techniques;Joint Manipulations;Energy conservation;Spinal Manipulations;Passive range of motion;Taping    PT Next Visit Plan assess and progress    Consulted and Agree with Plan of Care Patient           Patient will benefit from skilled therapeutic intervention in order to improve the following deficits and impairments:  Decreased range of motion,Decreased endurance,Increased muscle spasms,Impaired UE functional use,Pain,Impaired flexibility,Improper body mechanics,Postural dysfunction,Decreased strength,Decreased mobility  Visit Diagnosis: Abnormal posture  Muscle weakness (generalized)  Cervicalgia  Pain in left arm  Other muscle spasm  Radiculopathy, cervical region     Problem List Patient Active Problem List   Diagnosis Date Noted  . Recurrent major depressive disorder, in full remission (HCC) 08/27/2020  . Cellulitis and abscess of leg 11/15/2019  . RLS (restless legs syndrome) 08/12/2019  . Fracture of middle phalanx of finger 04/30/2018  . Closed fracture of distal phalanx of finger 04/30/2018  . Class 3 severe obesity with body mass index (BMI) of 40.0 to 44.9 in adult [facility]) 03/23/2018  . OSA treated with BiPAP 03/23/2018  . Tachycardia 03/23/2018  . Episode of recurrent major depressive disorder (HCC)  03/23/2018  . Attention deficit disorder (ADD) without hyperactivity 03/23/2018  . Presence of of 52 mg levonorgestrel-releasing intrauterine device (IUD) 02/22/2016    02/24/2016, PT, DPT 01/25/2021, 10:05 AM  [facility] [address] [place], [place], [place] Phone: [phone]   Fax:  [phone]  Name: Debbie White MRN: Nino Parsley Date of Birth: Sep 01, 1981

## 2021-01-29 DIAGNOSIS — G4733 Obstructive sleep apnea (adult) (pediatric): Secondary | ICD-10-CM | POA: Diagnosis not present

## 2021-02-01 ENCOUNTER — Ambulatory Visit: Payer: BC Managed Care – PPO | Attending: Family Medicine | Admitting: Physical Therapy

## 2021-02-01 ENCOUNTER — Other Ambulatory Visit: Payer: Self-pay

## 2021-02-01 DIAGNOSIS — M79602 Pain in left arm: Secondary | ICD-10-CM | POA: Diagnosis not present

## 2021-02-01 DIAGNOSIS — R293 Abnormal posture: Secondary | ICD-10-CM | POA: Insufficient documentation

## 2021-02-01 DIAGNOSIS — M542 Cervicalgia: Secondary | ICD-10-CM | POA: Diagnosis not present

## 2021-02-01 NOTE — Therapy (Signed)
[place]. [place], [place], [address] Phone: [phone]   Fax:  [phone]  Physical Therapy Treatment  Patient Details  Name: NAMITA YEARWOOD MRN: [id number] Date of Birth: [birth year] Referring Provider (PT): Merleen Nicely Just NP, at primary care office.   Encounter Date: 02/01/2021   PT End of Session - 02/01/21 1010    Visit Number 4    Number of Visits 9    Date for PT Re-Evaluation 03/08/21    PT Start Time 0925    PT Stop Time 1010    PT Time Calculation (min) 45 min           Past Medical History:  Diagnosis Date  . Cellulitis and abscess of leg 11/15/2019  . Depression     No past surgical history on file.  There were no vitals filed for this visit.   Subjective Assessment - 02/01/21 0926    Subjective pt verb doing HEP , overall when askes states 70% better, neck pain is almost gone- just radiating symptoms down left UE                             [facility] Adult PT Treatment/Exercise - 02/01/21 0001      Neck Exercises: Machines for Strengthening   UBE (Upper Arm Bike) L 4,  3 min fwd/3 min back    Lat Pull 20# 2 sets 10      Neck Exercises: Standing   Other Standing Exercises 10 # cable pulleys shld ext,row and horz abd 15 each with postural cuing   ball vs wall 5x CW and CCW   Other Standing Exercises cerv retraction with head on ball standing with 4# UE ex- multi ex while stab cerv                    PT Short Term Goals - 01/18/21 0954      PT SHORT TERM GOAL #1   Title independent with initial HEP    Status Achieved             PT Long Term Goals - 02/01/21 0948      PT LONG TERM GOAL #1   Title Pt will demo independence with advanced HEP    Status Achieved      PT LONG TERM GOAL #2   Title Pt will demo improved grip strength on left to be witin at least 5# of right for functional grip with ADLs    Status Achieved      PT LONG TERM GOAL #3    Title Pt will report decreased neck pain to </= 1/10 with little to no radiculopathy into LUE    Status Partially Met      PT LONG TERM GOAL #4   Title Pt will report decrease in symptoms during work day    Status Partially Met                 Plan - 02/01/21 0949    Clinical Impression Statement cerv ROM and BIL UE strength WNLS. tolerating ex well but does need postural cuing as she tends to drop her head- pt verb she is working on this .educ on doing cerv retractions through out the day at work.RT grip 60 # and Left 57#,right hand dominate. progressing goals. pt reponded well to traction.    PT Treatment/Interventions ADLs/Self Care Home Management;Cryotherapy;Electrical Stimulation;Iontophoresis 69m/ml Dexamethasone;Moist  Heat;Neuromuscular re-education;Therapeutic exercise;Therapeutic activities;Functional mobility training;Patient/family education;Manual techniques;Joint Manipulations;Energy conservation;Spinal Manipulations;Passive range of motion;Taping    PT Next Visit Plan assess and progress           Patient will benefit from skilled therapeutic intervention in order to improve the following deficits and impairments:  Decreased range of motion,Decreased endurance,Increased muscle spasms,Impaired UE functional use,Pain,Impaired flexibility,Improper body mechanics,Postural dysfunction,Decreased strength,Decreased mobility  Visit Diagnosis: Abnormal posture  Cervicalgia  Pain in left arm     Problem List Patient Active Problem List   Diagnosis Date Noted  . Recurrent major depressive disorder, in full remission ([place]) 08/27/2020  . Cellulitis and abscess of leg 11/15/2019  . RLS (restless legs syndrome) 08/12/2019  . Fracture of middle phalanx of finger 04/30/2018  . Closed fracture of distal phalanx of finger 04/30/2018  . Class 3 severe obesity with body mass index (BMI) of 40.0 to 44.9 in adult [facility]) 03/23/2018  . OSA treated with BiPAP 03/23/2018  .  Tachycardia 03/23/2018  . Episode of recurrent major depressive disorder ([place]) 03/23/2018  . Attention deficit disorder (ADD) without hyperactivity 03/23/2018  . Presence of of 52 mg levonorgestrel-releasing intrauterine device (IUD) 02/22/2016    Leighton Luster,ANGIE PTA 02/01/2021, 10:11 AM  [place]. [place], [place], [address] Phone: [phone]   Fax:  [phone]  Name: KIMIKA STREATER MRN: [id number] Date of Birth: [birth year]

## 2021-02-08 ENCOUNTER — Other Ambulatory Visit: Payer: Self-pay

## 2021-02-08 ENCOUNTER — Ambulatory Visit: Payer: BC Managed Care – PPO | Admitting: Physical Therapy

## 2021-02-08 DIAGNOSIS — M79602 Pain in left arm: Secondary | ICD-10-CM | POA: Diagnosis not present

## 2021-02-08 DIAGNOSIS — R293 Abnormal posture: Secondary | ICD-10-CM | POA: Diagnosis not present

## 2021-02-08 DIAGNOSIS — M542 Cervicalgia: Secondary | ICD-10-CM
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
## 2021-02-15 ENCOUNTER — Ambulatory Visit: Payer: BC Managed Care – PPO | Admitting: Physical Therapy

## 2021-02-22 ENCOUNTER — Ambulatory Visit: Payer: BC Managed Care – PPO | Admitting: Physical Therapy

## 2021-02-26 ENCOUNTER — Other Ambulatory Visit: Payer: Self-pay | Admitting: Family Medicine

## 2021-02-26 DIAGNOSIS — F909 Attention-deficit hyperactivity disorder, unspecified type: Secondary | ICD-10-CM | POA: Diagnosis not present

## 2021-02-26 DIAGNOSIS — F988 Other specified behavioral and emotional disorders with onset usually occurring in childhood and adolescence: Secondary | ICD-10-CM

## 2021-02-26 MED ORDER — AMPHETAMINE-DEXTROAMPHET ER 20 MG PO CP24: 20.0000 mg | ORAL_CAPSULE | Freq: Every day | ORAL | 0 refills | Status: AC

## 2021-02-26 NOTE — Telephone Encounter (Signed)
Hi Dr. Neva Seat,  This is a patient of Macario Carls Just, she has an appointment on 4-18 with a new provider.  Can you fill her adderall until that appointment.  Thanks Raynelle Fanning

## 2021-02-26 NOTE — Telephone Encounter (Signed)
Chart reviewed, recent office visit to discuss medication.  Recent toxassure noted as well as clarification. Last filled February 24.Controlled substance database (PDMP) reviewed. No concerns appreciated.  Refill ordered.

## 2021-02-26 NOTE — Telephone Encounter (Signed)
Patient was last seen by Macario Carls Just on  12/26/2020. She is need her Adderall filled until she can be seen at new clinic on 03/18/2021. Would you be able to fill for patient.

## 2021-03-01 ENCOUNTER — Ambulatory Visit: Payer: BC Managed Care – PPO | Attending: Family Medicine | Admitting: Physical Therapy

## 2021-03-08 ENCOUNTER — Ambulatory Visit: Payer: BC Managed Care – PPO

## 2021-03-18 DIAGNOSIS — G2581 Restless legs syndrome: Secondary | ICD-10-CM | POA: Diagnosis not present

## 2021-03-18 DIAGNOSIS — F329 Major depressive disorder, single episode, unspecified: Secondary | ICD-10-CM | POA: Diagnosis not present

## 2021-03-18 DIAGNOSIS — F909 Attention-deficit hyperactivity disorder, unspecified type: Secondary | ICD-10-CM | POA: Diagnosis not present

## 2021-03-28 ENCOUNTER — Other Ambulatory Visit: Payer: Self-pay | Admitting: Neurology

## 2021-03-28 DIAGNOSIS — G2581 Restless legs syndrome: Secondary | ICD-10-CM

## 2021-04-08 DIAGNOSIS — F331 Major depressive disorder, recurrent, moderate: Secondary | ICD-10-CM | POA: Diagnosis not present

## 2021-04-08 DIAGNOSIS — F900 Attention-deficit hyperactivity disorder, predominantly inattentive type: Secondary | ICD-10-CM | POA: Diagnosis not present

## 2021-04-22 DIAGNOSIS — F900 Attention-deficit hyperactivity disorder, predominantly inattentive type: Secondary | ICD-10-CM | POA: Diagnosis not present

## 2021-04-22 DIAGNOSIS — F331 Major depressive disorder, recurrent, moderate: Secondary | ICD-10-CM | POA: Diagnosis not present

## 2021-04-30 DIAGNOSIS — G4733 Obstructive sleep apnea (adult) (pediatric): Secondary | ICD-10-CM | POA: Diagnosis not present

## 2021-05-02 ENCOUNTER — Ambulatory Visit (INDEPENDENT_AMBULATORY_CARE_PROVIDER_SITE_OTHER): Payer: BC Managed Care – PPO | Admitting: Advanced Practice Midwife

## 2021-05-02 ENCOUNTER — Other Ambulatory Visit: Payer: Self-pay

## 2021-05-02 ENCOUNTER — Encounter: Payer: Self-pay | Admitting: Advanced Practice Midwife

## 2021-05-02 VITALS — BP 128/86 | HR 99 | Wt 199.2 lb

## 2021-05-02 DIAGNOSIS — Z30430 Encounter for insertion of intrauterine contraceptive device: Secondary | ICD-10-CM | POA: Diagnosis not present

## 2021-05-02 DIAGNOSIS — Z30433 Encounter for removal and reinsertion of intrauterine contraceptive device: Secondary | ICD-10-CM

## 2021-05-02 MED ORDER — LEVONORGESTREL 20.1 MCG/DAY IU IUD
1.0000 | INTRAUTERINE_SYSTEM | Freq: Once | INTRAUTERINE | Status: AC
  Administered 2021-05-02: 1 via INTRAUTERINE

## 2021-05-02 NOTE — Progress Notes (Signed)
  Subjective:     Patient ID: Debbie White, female   DOB: 1981-09-10, 40 y.o.   MRN: [id number]  Debbie White is a 40 y.o. [id number] who is here today for IUD removal and reinsertion. She has Mirena IUD placed that was due for removal March 2022. She has questions regarding Mirena v Paragard. No complaints with mirena at this time. Does not get a period with Mirena.     Review of Systems  All other systems reviewed and are negative.      Objective:   Physical Exam Vitals and nursing note reviewed. Exam conducted with a chaperone present.  Constitutional:      General: She is not in acute distress. HENT:     Head: Normocephalic.  Cardiovascular:     Rate and Rhythm: Normal rate.  Pulmonary:     Effort: Pulmonary effort is normal.  Abdominal:     Palpations: Abdomen is soft.     Tenderness: There is no abdominal tenderness.  Genitourinary:    Comments:  External: no lesion Vagina: small amount of white discharge Cervix: pink, smooth, no CMT Uterus: NSSC Adnexa: NT  Neurological:     Mental Status: She is alert and oriented to person, place, and time.  Psychiatric:        Mood and Affect: Mood normal.        Behavior: Behavior normal.    GYNECOLOGY OFFICE PROCEDURE NOTE  Debbie White is a 41 y.o. [id number] here for Mirena IUD removal. No GYN concerns.  Last pap smear was on 10/2021 ASCU, HPV  IUD Removal  Patient identified, informed consent performed, consent signed.  Patient was in the dorsal lithotomy position, normal external genitalia was noted.  A speculum was placed in the patient's vagina, normal discharge was noted, no lesions. The cervix was visualized, no lesions, no abnormal discharge.  The strings of the IUD were grasped and pulled using ring forceps. The IUD was removed in its entirety.   Patient tolerated the procedure well.    GYNECOLOGY OFFICE PROCEDURE NOTE  IUD Insertion Procedure Note Patient identified, informed consent performed, consent  signed.   Discussed risks of irregular bleeding, cramping, infection, malpositioning or misplacement of the IUD outside the uterus which may require further procedure such as laparoscopy. Time out was performed.  Urine pregnancy test negative.  Speculum placed in the vagina.  Cervix visualized.  Cleaned with Betadine x 2.  Grasped anteriorly with a single tooth tenaculum.  Uterus sounded to 7 cm.  Liletta IUD placed per manufacturer's recommendations.  Strings trimmed to 3 cm. Tenaculum was removed, good hemostasis noted.  Patient tolerated procedure well.   Patient was given post-procedure instructions.  She was advised to have backup contraception for one week.  Patient was also asked to check IUD strings periodically and follow up in 4 weeks for IUD check.       Assessment:     1. Encounter for IUD insertion   2. Encounter for IUD removal and reinsertion        Plan:     FU in 4 weeks for string check  Due for mammo in November 2022, orders placed today so patient can call when it is due.   Thressa Sheller DNP, CNM  05/02/21  4:08 PM

## 2021-05-02 NOTE — Patient Instructions (Signed)

## 2021-05-21 DIAGNOSIS — F331 Major depressive disorder, recurrent, moderate: Secondary | ICD-10-CM | POA: Diagnosis not present

## 2021-05-21 DIAGNOSIS — F900 Attention-deficit hyperactivity disorder, predominantly inattentive type: Secondary | ICD-10-CM | POA: Diagnosis not present

## 2021-05-25 ENCOUNTER — Encounter: Payer: Self-pay | Admitting: Adult Health

## 2021-05-27 ENCOUNTER — Ambulatory Visit: Payer: BC Managed Care – PPO | Admitting: Adult Health

## 2021-05-27 ENCOUNTER — Encounter: Payer: Self-pay | Admitting: Adult Health

## 2021-05-29 DIAGNOSIS — F900 Attention-deficit hyperactivity disorder, predominantly inattentive type: Secondary | ICD-10-CM | POA: Diagnosis not present

## 2021-05-29 DIAGNOSIS — F331 Major depressive disorder, recurrent, moderate: Secondary | ICD-10-CM | POA: Diagnosis not present

## 2021-06-05 DIAGNOSIS — F331 Major depressive disorder, recurrent, moderate: Secondary | ICD-10-CM | POA: Diagnosis not present

## 2021-06-05 DIAGNOSIS — F900 Attention-deficit hyperactivity disorder, predominantly inattentive type: Secondary | ICD-10-CM | POA: Diagnosis not present

## 2021-06-17 DIAGNOSIS — F900 Attention-deficit hyperactivity disorder, predominantly inattentive type: Secondary | ICD-10-CM | POA: Diagnosis not present

## 2021-06-17 DIAGNOSIS — F331 Major depressive disorder, recurrent, moderate: Secondary | ICD-10-CM | POA: Diagnosis not present

## 2021-06-18 ENCOUNTER — Ambulatory Visit: Payer: BC Managed Care – PPO | Admitting: Student

## 2021-06-26 ENCOUNTER — Ambulatory Visit: Payer: BC Managed Care – PPO | Admitting: Family Medicine

## 2021-06-26 DIAGNOSIS — F909 Attention-deficit hyperactivity disorder, unspecified type: Secondary | ICD-10-CM | POA: Diagnosis not present

## 2021-06-26 DIAGNOSIS — G2581 Restless legs syndrome: Secondary | ICD-10-CM | POA: Diagnosis not present

## 2021-06-26 DIAGNOSIS — F329 Major depressive disorder, single episode, unspecified: Secondary | ICD-10-CM | POA: Diagnosis not present

## 2021-07-02 DIAGNOSIS — F331 Major depressive disorder, recurrent, moderate: Secondary | ICD-10-CM | POA: Diagnosis not present

## 2021-07-02 DIAGNOSIS — F900 Attention-deficit hyperactivity disorder, predominantly inattentive type: Secondary | ICD-10-CM | POA: Diagnosis not present

## 2021-07-15 DIAGNOSIS — F331 Major depressive disorder, recurrent, moderate: Secondary | ICD-10-CM | POA: Diagnosis not present

## 2021-07-15 DIAGNOSIS — F900 Attention-deficit hyperactivity disorder, predominantly inattentive type: Secondary | ICD-10-CM | POA: Diagnosis not present

## 2021-07-29 DIAGNOSIS — G4733 Obstructive sleep apnea (adult) (pediatric): Secondary | ICD-10-CM | POA: Diagnosis not present

## 2021-08-26 ENCOUNTER — Encounter: Payer: Self-pay | Admitting: Adult Health

## 2021-08-26 DIAGNOSIS — G4733 Obstructive sleep apnea (adult) (pediatric): Secondary | ICD-10-CM

## 2021-09-09 DIAGNOSIS — F331 Major depressive disorder, recurrent, moderate: Secondary | ICD-10-CM | POA: Diagnosis not present

## 2021-09-09 DIAGNOSIS — F900 Attention-deficit hyperactivity disorder, predominantly inattentive type: Secondary | ICD-10-CM | POA: Diagnosis not present

## 2021-09-17 ENCOUNTER — Other Ambulatory Visit: Payer: Self-pay

## 2021-09-17 ENCOUNTER — Ambulatory Visit (INDEPENDENT_AMBULATORY_CARE_PROVIDER_SITE_OTHER): Payer: BC Managed Care – PPO | Admitting: Adult Health

## 2021-09-17 ENCOUNTER — Encounter: Payer: Self-pay | Admitting: Adult Health

## 2021-09-17 VITALS — BP 123/87 | HR 91 | Ht 61.0 in | Wt 195.6 lb

## 2021-09-17 DIAGNOSIS — G4733 Obstructive sleep apnea (adult) (pediatric): Secondary | ICD-10-CM | POA: Diagnosis not present

## 2021-09-17 NOTE — Progress Notes (Signed)
PATIENT: Debbie White DOB: February 09, 1981  REASON FOR VISIT: follow up HISTORY FROM: patient   HISTORY OF PRESENT ILLNESS: Today 09/17/21:  Debbie White is a 40 year old female with a history of obstructive sleep apnea on BiPAP.  She returns today for follow-up.  She reports that after having COVID she is found it hard to use the CPAP machine.  She did try using it during the day while watching television but she feels that the pressure is too high.  She states that she has a hard time breathing with the machine.  She also feels that a different mask may make her feel less claustrophobic.  HISTORY 11/26/2020: I reviewed her BiPAP compliance data from 10/26/2020 through 11/24/2020, which is a total of 30 days, during which time she used her machine 21 days with percent use days greater than 4 hours at 70%, indicating adequate compliance with an average usage of 7 hours and 56 minutes, residual AHI 4.4/h, pressure of 18/14 centimeters, leak on the low side with a 95th percentile at 2.5 L/min.  She reports having a love-hate relationship with her BiPAP.  Sometimes she skips using it, particularly on the weekends, as she can sleep in a little longer and take a nap if needed.  She does admit that she does not sleep as well and does not feel as good the next day when she does not use her BiPAP.  She is working on weight loss.  She is agreeable to maintaining BiPAP therapy until she has achieved more weight loss and would be interested in pursuing another sleep test for reevaluation. She is still interested in pursuing inspire therapy longer-term.   The patient's allergies, current medications, family history, past medical history, past social history, past surgical history and problem list were reviewed and updated as appropriate.  REVIEW OF SYSTEMS: Out of a complete 14 system review of symptoms, the patient complains only of the following symptoms, and all other reviewed systems are negative.   ESS  11  ALLERGIES: No Known Allergies  HOME MEDICATIONS: Outpatient Medications Prior to Visit  Medication Sig Dispense Refill   amphetamine-dextroamphetamine (ADDERALL XR) 20 MG 24 hr capsule Take 1 capsule (20 mg total) by mouth daily. 30 capsule 0   levonorgestrel (MIRENA) 20 MCG/24HR IUD 1 each by Intrauterine route once.     pramipexole (MIRAPEX) 0.125 MG tablet TAKE 2 TABLETS (0.25 MG TOTAL) BY MOUTH AT BEDTIME. TAKE 90-120 MINUTES BEFORE BEDTIME. 180 tablet 1   venlafaxine XR (EFFEXOR-XR) 150 MG 24 hr capsule TAKE 1 CAPSULE BY MOUTH EVERY DAY (Patient taking differently: Pt takes (1) 75mg  in and (1 )30mg   in am also) 90 capsule 1   No facility-administered medications prior to visit.    PAST MEDICAL HISTORY: Past Medical History:  Diagnosis Date   Cellulitis and abscess of leg 11/15/2019   Depression     PAST SURGICAL HISTORY: History reviewed. No pertinent surgical history.  FAMILY HISTORY: Family History  Problem Relation Age of Onset   Stroke Maternal Grandfather    Healthy Daughter    Diabetes Other    Hypertension Other    Sleep apnea Neg Hx     SOCIAL HISTORY: Social History   Socioeconomic History   Marital status: Single    Spouse name: Not on file   Number of children: Not on file   Years of education: Not on file   Highest education level: Not on file  Occupational History   Not on file  Tobacco Use   Smoking status: Every Day    Types: E-cigarettes   Smokeless tobacco: Never  Vaping Use   Vaping Use: Every day   Devices: nicotene vape  Substance and Sexual Activity   Alcohol use: Yes    Comment: Socially   Drug use: No   Sexual activity: Yes  Other Topics Concern   Not on file  Social History Narrative   Not on file   Social Determinants of Health   Financial Resource Strain: Not on file  Food Insecurity: No Food Insecurity   Worried About Running Out of Food in the Last Year: Never true   Ran Out of Food in the Last Year: Never true   Transportation Needs: No Transportation Needs   Lack of Transportation (Medical): No   Lack of Transportation (Non-Medical): No  Physical Activity: Not on file  Stress: Not on file  Social Connections: Not on file  Intimate Partner Violence: Not on file      PHYSICAL EXAM  Vitals:   09/17/21 1113  BP: 123/87  Pulse: 91  Weight: 195 lb 9.6 oz (88.7 kg)  Height: 5\' 1"  (1.549 m)   Body mass index is 36.96 kg/m.  Generalized: Well developed, in no acute distress  Chest: Lungs clear to auscultation bilaterally  Neurological examination  Mentation: Alert oriented to time, place, history taking. Follows all commands speech and language fluent Cranial nerve II-XII: Extraocular movements were full, visual field were full on confrontational test Head turning and shoulder shrug  were normal and symmetric. Motor: The motor testing reveals 5 over 5 strength of all 4 extremities. Good symmetric motor tone is noted throughout.  Sensory: Sensory testing is intact to soft touch on all 4 extremities. No evidence of extinction is noted.  Gait and station: Gait is normal.    DIAGNOSTIC DATA (LABS, IMAGING, TESTING) - I reviewed patient records, labs, notes, testing and imaging myself where available.  Lab Results  Component Value Date   WBC 7.5 10/29/2020   HGB 14.6 10/29/2020   HCT 43.8 10/29/2020   MCV 89 10/29/2020   PLT 352 10/29/2020      Component Value Date/Time   NA 141 10/29/2020 0835   K 4.2 10/29/2020 0835   CL 104 10/29/2020 0835   CO2 23 10/29/2020 0835   GLUCOSE 92 10/29/2020 0835   GLUCOSE 164 (H) 01/02/2019 2310   BUN 8 10/29/2020 0835   CREATININE 0.68 10/29/2020 0835   CALCIUM 9.1 10/29/2020 0835   PROT 6.9 10/29/2020 0835   ALBUMIN 4.4 10/29/2020 0835   AST 16 10/29/2020 0835   ALT 20 10/29/2020 0835   ALKPHOS 85 10/29/2020 0835   BILITOT 0.3 10/29/2020 0835   GFRNONAA 109 10/29/2020 0835   GFRAA 126 10/29/2020 0835   Lab Results  Component Value  Date   CHOL 187 10/29/2020   HDL 36 (L) 10/29/2020   LDLCALC 135 (H) 10/29/2020   TRIG 85 10/29/2020   CHOLHDL 5.2 (H) 10/29/2020   Lab Results  Component Value Date   HGBA1C 5.8 (H) 12/26/2020   No results found for: 12/28/2020 Lab Results  Component Value Date   TSH 1.830 11/09/2019      ASSESSMENT AND PLAN 40 y.o. year old female  has a past medical history of Cellulitis and abscess of leg (11/15/2019) and Depression. here with:  OSA on BiPAP  - Noncompliant with BiPAP - We will adjust pressure 17/13 cmH2O - Mask refitting ordered - F/U in 1 year or  sooner if needed     Butch Penny, MSN, NP-C 09/17/2021, 11:15 AM [facility] [address] [place], [place] [address] [phone]

## 2021-09-17 NOTE — Patient Instructions (Signed)
Continue using CPAP nightly and greater than 4 hours each night Pressure change 17-13 Mask refitting If your symptoms worsen or you develop new symptoms please let us know.

## 2021-09-18 NOTE — Progress Notes (Addendum)
CM sent to [facility] fro new cpap orders. cpap orders Received: Today Ross Ludwig, RN got it!   Thanks!      Previous Messages   ----- Message -----  From: Guy Begin, RN  Sent: 09/18/2021   5:03 PM EDT  To: Wil[facility]  Subject: new cpap orders                                 Hi new order in epic for pt  Debbie White  Debbie, 40 y.o., 1981-05-19  MRN:  [id number]  Thanks SY

## 2021-09-23 DIAGNOSIS — F331 Major depressive disorder, recurrent, moderate: Secondary | ICD-10-CM | POA: Diagnosis not present

## 2021-09-23 DIAGNOSIS — F900 Attention-deficit hyperactivity disorder, predominantly inattentive type: Secondary | ICD-10-CM | POA: Diagnosis not present

## 2021-09-23 NOTE — Addendum Note (Signed)
Addended by: Enedina Finner on: 09/23/2021 09:14 AM   Modules accepted: Orders

## 2021-09-30 DIAGNOSIS — R000 Tachycardia, unspecified: Secondary | ICD-10-CM | POA: Diagnosis not present

## 2021-09-30 DIAGNOSIS — F909 Attention-deficit hyperactivity disorder, unspecified type: Secondary | ICD-10-CM | POA: Diagnosis not present

## 2021-09-30 DIAGNOSIS — E669 Obesity, unspecified: Secondary | ICD-10-CM | POA: Diagnosis not present

## 2021-09-30 DIAGNOSIS — F329 Major depressive disorder, single episode, unspecified: Secondary | ICD-10-CM | POA: Diagnosis not present

## 2021-09-30 DIAGNOSIS — G2581 Restless legs syndrome: Secondary | ICD-10-CM | POA: Diagnosis not present

## 2021-12-13 ENCOUNTER — Other Ambulatory Visit: Payer: Self-pay | Admitting: Neurology

## 2021-12-13 ENCOUNTER — Ambulatory Visit
Admission: RE | Admit: 2021-12-13 | Discharge: 2021-12-13 | Disposition: A | Discharge status: Home / Self Care | Payer: BC Managed Care – PPO | Source: Ambulatory Visit | Attending: Advanced Practice Midwife | Admitting: Advanced Practice Midwife

## 2021-12-13 DIAGNOSIS — G2581 Restless legs syndrome: Secondary | ICD-10-CM

## 2021-12-13 DIAGNOSIS — Z1231 Encounter for screening mammogram for malignant neoplasm of breast: Secondary | ICD-10-CM | POA: Diagnosis not present

## 2021-12-13 DIAGNOSIS — Z30430 Encounter for insertion of intrauterine contraceptive device: Secondary | ICD-10-CM

## 2021-12-24 DIAGNOSIS — G4733 Obstructive sleep apnea (adult) (pediatric): Secondary | ICD-10-CM | POA: Diagnosis not present

## 2022-01-22 ENCOUNTER — Encounter: Payer: Self-pay | Admitting: Adult Health

## 2022-01-22 ENCOUNTER — Ambulatory Visit: Payer: BC Managed Care – PPO | Admitting: Adult Health

## 2022-01-24 DIAGNOSIS — G4733 Obstructive sleep apnea (adult) (pediatric): Secondary | ICD-10-CM | POA: Diagnosis not present

## 2022-02-21 DIAGNOSIS — G4733 Obstructive sleep apnea (adult) (pediatric): Secondary | ICD-10-CM | POA: Diagnosis not present

## 2022-04-09 DIAGNOSIS — F329 Major depressive disorder, single episode, unspecified: Secondary | ICD-10-CM | POA: Diagnosis not present

## 2022-04-09 DIAGNOSIS — Z0000 Encounter for general adult medical examination without abnormal findings: Secondary | ICD-10-CM | POA: Diagnosis not present

## 2022-04-09 DIAGNOSIS — F909 Attention-deficit hyperactivity disorder, unspecified type: Secondary | ICD-10-CM | POA: Diagnosis not present

## 2022-04-09 DIAGNOSIS — E669 Obesity, unspecified: Secondary | ICD-10-CM | POA: Diagnosis not present

## 2022-04-09 DIAGNOSIS — G2581 Restless legs syndrome: Secondary | ICD-10-CM | POA: Diagnosis not present

## 2022-07-01 DIAGNOSIS — R059 Cough, unspecified: Secondary | ICD-10-CM | POA: Diagnosis not present

## 2022-07-10 DIAGNOSIS — Z713 Dietary counseling and surveillance: Secondary | ICD-10-CM | POA: Diagnosis not present

## 2022-07-10 DIAGNOSIS — F909 Attention-deficit hyperactivity disorder, unspecified type: Secondary | ICD-10-CM | POA: Diagnosis not present

## 2022-07-10 DIAGNOSIS — Z6838 Body mass index (BMI) 38.0-38.9, adult: Secondary | ICD-10-CM | POA: Diagnosis not present

## 2022-08-19 IMAGING — MG DIGITAL SCREENING BILAT W/ TOMO W/ CAD
8 series · 8 of 24 positions shown · non-contrast
Comparison: Previous exam(s).

ACR Breast Density Category a: The breast tissue is almost entirely
fatty.

CLINICAL DATA: Screening.

EXAM:
DIGITAL SCREENING BILATERAL MAMMOGRAM WITH TOMO AND CAD

[L CC synth-2D]
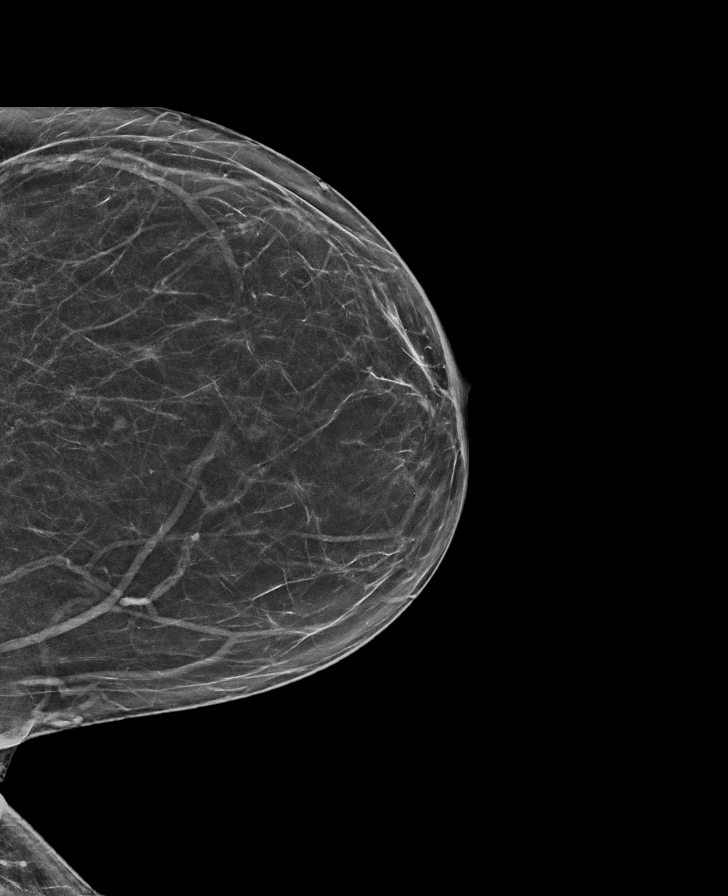

[R MLO synth-2D]
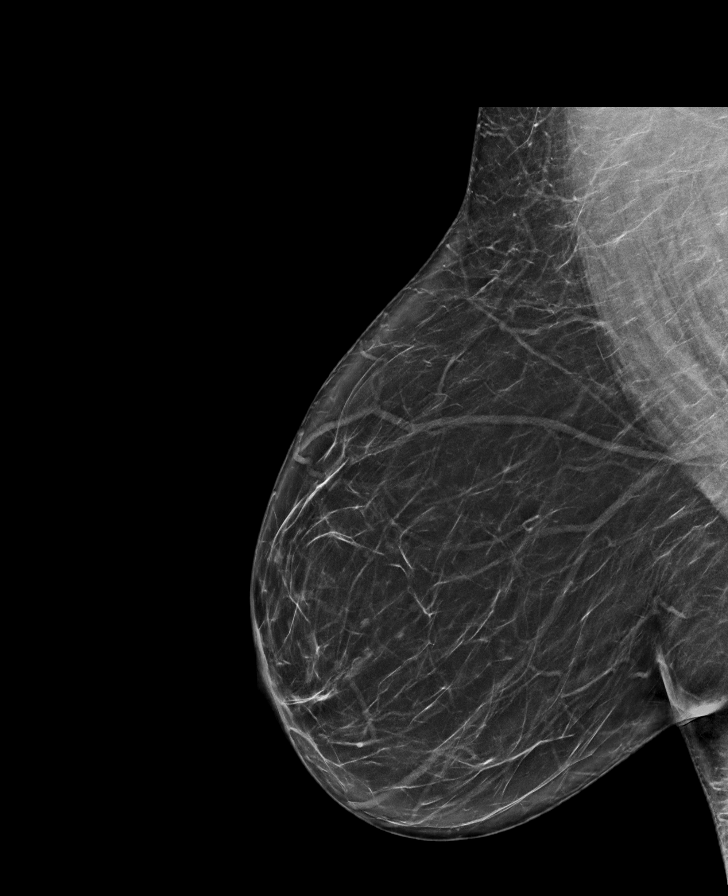

[R CC synth-2D]
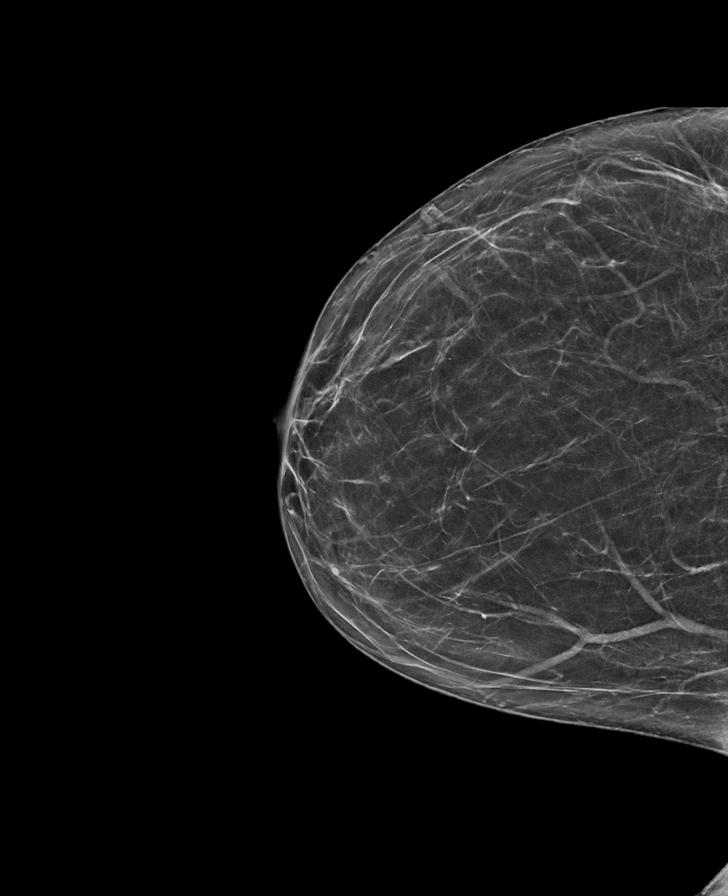

[L MLO synth-2D]
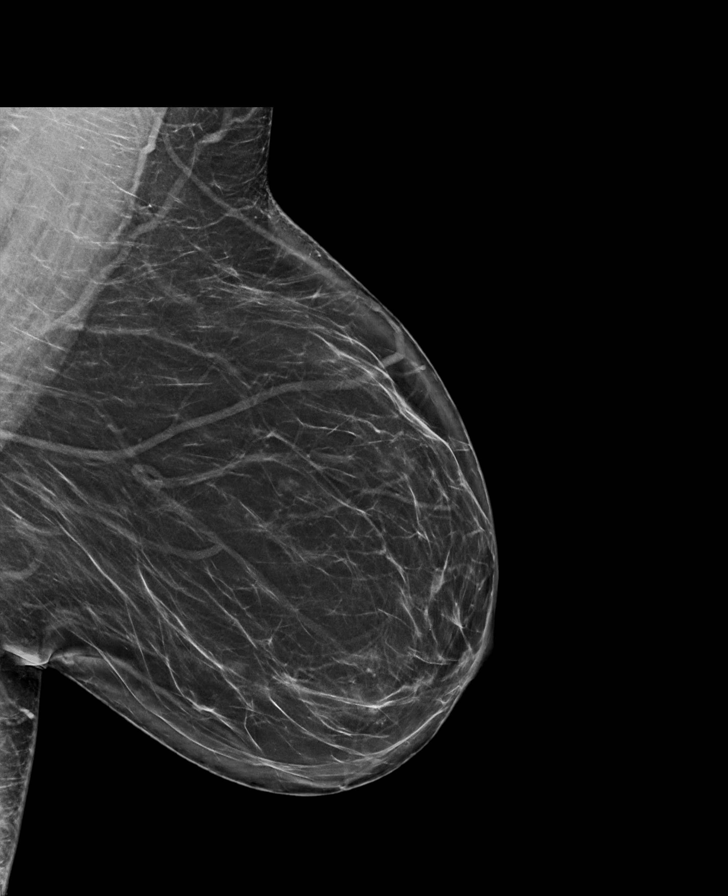

[L CC tomo · tomo slice 34/67.0]
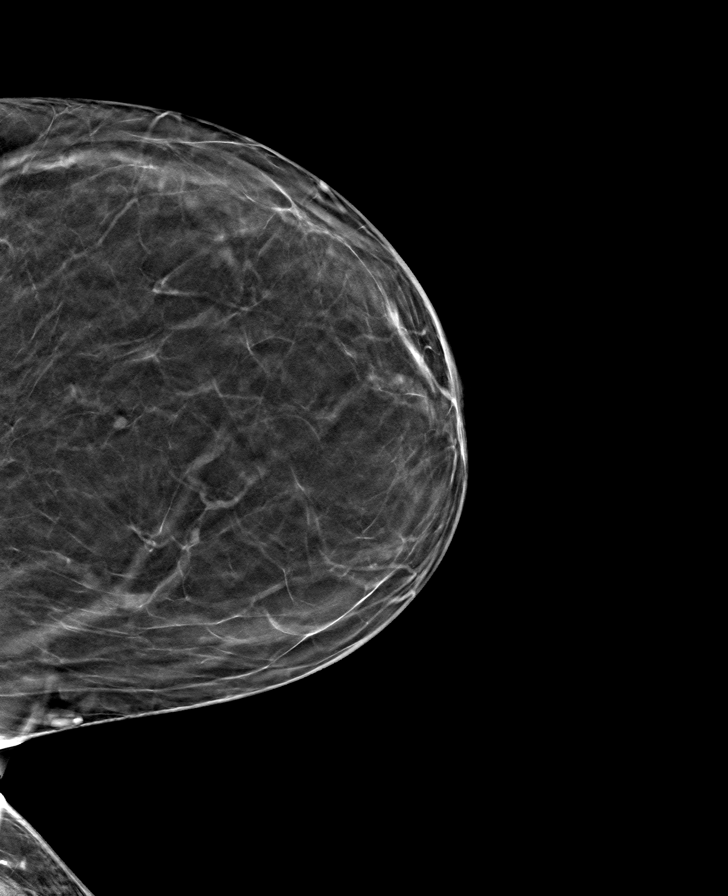

[R CC tomo · tomo slice 33/65.0]
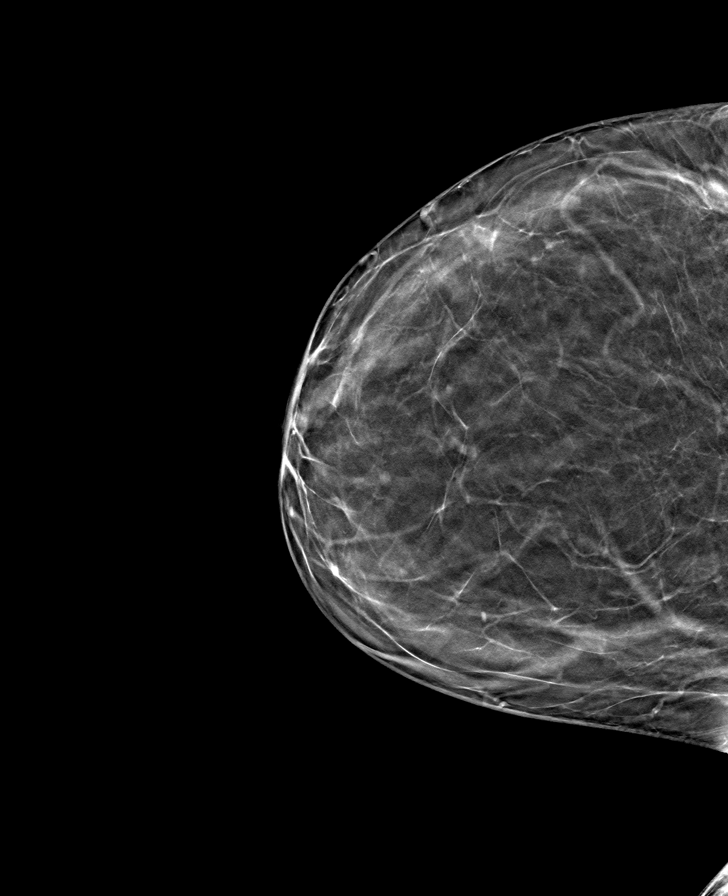

[L MLO tomo · tomo slice 41/80.0]
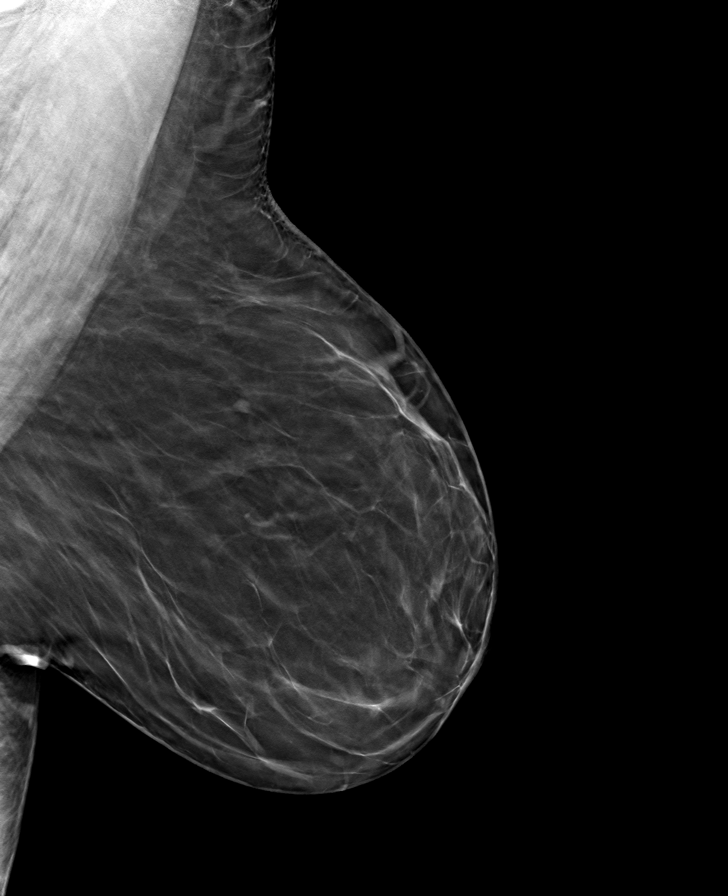

[R MLO tomo · tomo slice 41/80.0]
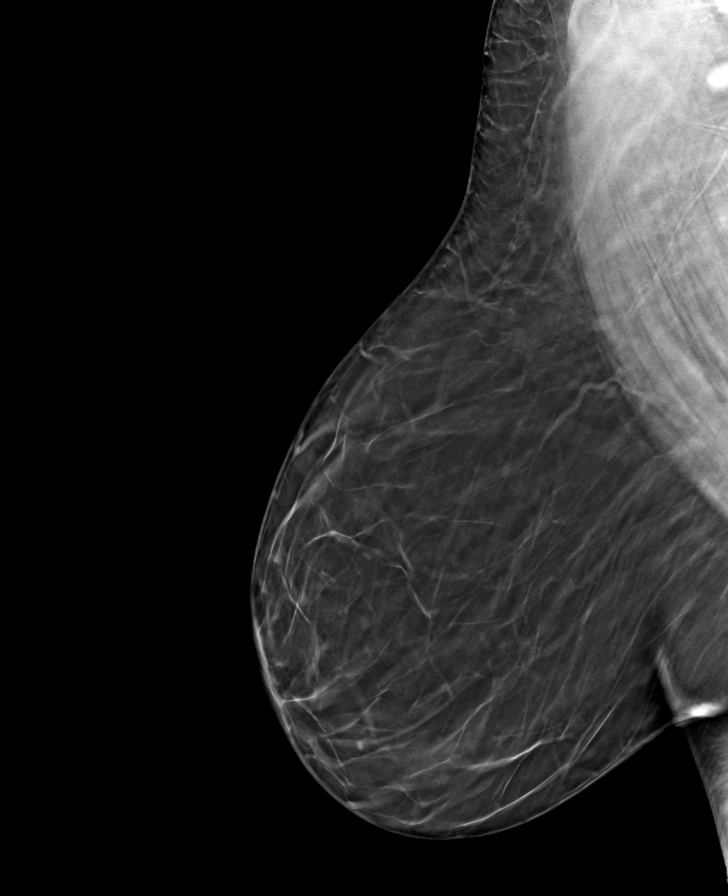

[8 of 24 positions shown; findings below may reference images not displayed]

FINDINGS: There are no findings suspicious for malignancy. Images were
processed with CAD.
IMPRESSION: No mammographic evidence of malignancy. A result letter of this
screening mammogram will be mailed directly to the patient.

RECOMMENDATION:
Screening mammogram in one year. (Code:[id number])

BI-RADS CATEGORY  1: Negative.

## 2022-09-21 DIAGNOSIS — H6992 Unspecified Eustachian tube disorder, left ear: Secondary | ICD-10-CM | POA: Diagnosis not present

## 2022-09-21 DIAGNOSIS — H60502 Unspecified acute noninfective otitis externa, left ear: Secondary | ICD-10-CM | POA: Diagnosis not present

## 2022-10-27 DIAGNOSIS — F909 Attention-deficit hyperactivity disorder, unspecified type: Secondary | ICD-10-CM | POA: Diagnosis not present

## 2022-10-27 DIAGNOSIS — F329 Major depressive disorder, single episode, unspecified: Secondary | ICD-10-CM | POA: Diagnosis not present

## 2022-10-27 DIAGNOSIS — G2581 Restless legs syndrome: Secondary | ICD-10-CM | POA: Diagnosis not present

## 2022-10-27 DIAGNOSIS — Z23 Encounter for immunization: Secondary | ICD-10-CM | POA: Diagnosis not present

## 2022-11-18 ENCOUNTER — Encounter: Payer: Self-pay | Admitting: *Deleted

## 2023-06-12 DIAGNOSIS — Z0000 Encounter for general adult medical examination without abnormal findings: Secondary | ICD-10-CM | POA: Diagnosis not present

## 2023-06-12 DIAGNOSIS — R7303 Prediabetes: Secondary | ICD-10-CM | POA: Diagnosis not present

## 2023-06-12 DIAGNOSIS — Z13220 Encounter for screening for lipoid disorders: Secondary | ICD-10-CM | POA: Diagnosis not present

## 2023-07-31 DIAGNOSIS — E785 Hyperlipidemia, unspecified: Secondary | ICD-10-CM | POA: Diagnosis not present

## 2023-12-08 DIAGNOSIS — G2581 Restless legs syndrome: Secondary | ICD-10-CM | POA: Diagnosis not present

## 2023-12-08 DIAGNOSIS — E785 Hyperlipidemia, unspecified: Secondary | ICD-10-CM | POA: Diagnosis not present

## 2023-12-08 DIAGNOSIS — F909 Attention-deficit hyperactivity disorder, unspecified type: Secondary | ICD-10-CM | POA: Diagnosis not present

## 2023-12-08 DIAGNOSIS — F329 Major depressive disorder, single episode, unspecified: Secondary | ICD-10-CM | POA: Diagnosis not present

## 2023-12-25 DIAGNOSIS — L0291 Cutaneous abscess, unspecified: Secondary | ICD-10-CM | POA: Diagnosis not present

## 2024-06-17 DIAGNOSIS — Z0000 Encounter for general adult medical examination without abnormal findings: Secondary | ICD-10-CM | POA: Diagnosis not present

## 2024-06-17 DIAGNOSIS — E785 Hyperlipidemia, unspecified: Secondary | ICD-10-CM | POA: Diagnosis not present

## 2024-06-17 DIAGNOSIS — R7303 Prediabetes: Secondary | ICD-10-CM | POA: Diagnosis not present

## 2024-06-17 DIAGNOSIS — F909 Attention-deficit hyperactivity disorder, unspecified type: Secondary | ICD-10-CM | POA: Diagnosis not present

## 2024-06-17 DIAGNOSIS — F329 Major depressive disorder, single episode, unspecified: Secondary | ICD-10-CM | POA: Diagnosis not present

## 2024-06-17 DIAGNOSIS — G2581 Restless legs syndrome: Secondary | ICD-10-CM | POA: Diagnosis not present

## 2024-07-07 DIAGNOSIS — Z1211 Encounter for screening for malignant neoplasm of colon: Secondary | ICD-10-CM | POA: Diagnosis not present

## 2024-07-07 DIAGNOSIS — Z1212 Encounter for screening for malignant neoplasm of rectum: Secondary | ICD-10-CM | POA: Diagnosis not present
